# Patient Record
Sex: Male | Born: 1985 | Race: Black or African American | Hispanic: No | Marital: Single
Health system: Midwestern US, Community
[De-identification: ages and names within clinical notes are randomized; demographics above are authoritative.]

## PROBLEM LIST (undated history)

## (undated) DIAGNOSIS — K219 Gastro-esophageal reflux disease without esophagitis: Secondary | ICD-10-CM

## (undated) DIAGNOSIS — T7840XA Allergy, unspecified, initial encounter: Secondary | ICD-10-CM

## (undated) DIAGNOSIS — N179 Acute kidney failure, unspecified: Secondary | ICD-10-CM

## (undated) DIAGNOSIS — Z9109 Other allergy status, other than to drugs and biological substances: Secondary | ICD-10-CM

## (undated) HISTORY — PX: NO PAST SURGERIES: SHX2092

## (undated) HISTORY — DX: Gastro-esophageal reflux disease without esophagitis: K21.9

## (undated) HISTORY — DX: Allergy, unspecified, initial encounter: T78.40XA

---
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
## 2009-08-23 ENCOUNTER — Emergency Department (HOSPITAL_COMMUNITY): Admission: EM | Admit: 2009-08-23 | Discharge: 2009-08-23 | Payer: Self-pay | Admitting: Emergency Medicine

## 2011-02-05 ENCOUNTER — Inpatient Hospital Stay (INDEPENDENT_AMBULATORY_CARE_PROVIDER_SITE_OTHER)
Admission: RE | Admit: 2011-02-05 | Discharge: 2011-02-05 | Disposition: A | Discharge status: Home / Self Care | Payer: Self-pay | Source: Ambulatory Visit | Attending: Emergency Medicine | Admitting: Emergency Medicine

## 2011-02-05 ENCOUNTER — Ambulatory Visit (INDEPENDENT_AMBULATORY_CARE_PROVIDER_SITE_OTHER): Payer: Self-pay

## 2011-02-05 DIAGNOSIS — S40019A Contusion of unspecified shoulder, initial encounter: Secondary | ICD-10-CM

## 2011-02-05 DIAGNOSIS — D485 Neoplasm of uncertain behavior of skin: Secondary | ICD-10-CM

## 2012-04-29 ENCOUNTER — Emergency Department (HOSPITAL_COMMUNITY)
Admission: EM | Admit: 2012-04-29 | Discharge: 2012-04-29 | Disposition: A | Discharge status: Home / Self Care | Payer: Self-pay | Attending: Emergency Medicine | Admitting: Emergency Medicine

## 2012-04-29 ENCOUNTER — Encounter (HOSPITAL_COMMUNITY): Payer: Self-pay | Admitting: Emergency Medicine

## 2012-04-29 DIAGNOSIS — M549 Dorsalgia, unspecified: Secondary | ICD-10-CM | POA: Insufficient documentation

## 2012-04-29 MED ORDER — OXYCODONE-ACETAMINOPHEN 5-325 MG PO TABS: 2.0000 | ORAL_TABLET | ORAL | Status: AC | PRN

## 2012-04-29 MED ORDER — CYCLOBENZAPRINE HCL 10 MG PO TABS: 10.0000 mg | ORAL_TABLET | Freq: Two times a day (BID) | ORAL | Status: AC | PRN

## 2012-04-29 MED ORDER — IBUPROFEN 600 MG PO TABS: 600.0000 mg | ORAL_TABLET | Freq: Four times a day (QID) | ORAL | Status: AC | PRN

## 2012-04-29 NOTE — ED Notes (Signed)
Pt. Stated, I started having back pain lower yesterday at work, no injury

## 2012-04-29 NOTE — Discharge Instructions (Signed)
Back Exercises   Back exercises help treat and prevent back injuries. The goal of back exercises is to increase the strength of your abdominal and back muscles and the flexibility of your back. These exercises should be started when you no longer have back pain. Back exercises include:   Pelvic Tilt. Lie on your back with your knees bent. Tilt your pelvis until the lower part of your back is against the floor. Hold this position 5 to 10 sec and repeat 5 to 10 times.   Knee to Chest. Pull first 1 knee up against your chest and hold for 20 to 30 seconds, repeat this with the other knee, and then both knees. This may be done with the other leg straight or bent, whichever feels better.   Sit-Ups or Curl-Ups. Bend your knees 90 degrees. Start with tilting your pelvis, and do a partial, slow sit-up, lifting your trunk only 30 to 45 degrees off the floor. Take at least 2 to 3 seconds for each sit-up. Do not do sit-ups with your knees out straight. If partial sit-ups are difficult, simply do the above but with only tightening your abdominal muscles and holding it as directed.   Hip-Lift. Lie on your back with your knees flexed 90 degrees. Push down with your feet and shoulders as you raise your hips a couple inches off the floor; hold for 10 seconds, repeat 5 to 10 times.   Back arches. Lie on your stomach, propping yourself up on bent elbows. Slowly press on your hands, causing an arch in your low back. Repeat 3 to 5 times. Any initial stiffness and discomfort should lessen with repetition over time.   Shoulder-Lifts. Lie face down with arms beside your body. Keep hips and torso pressed to floor as you slowly lift your head and shoulders off the floor.   Do not overdo your exercises, especially in the beginning. Exercises may cause you some mild back discomfort which lasts for a few minutes; however, if the pain is more severe, or lasts for more than 15 minutes, do not continue exercises until you see your caregiver.  Improvement with exercise therapy for back problems is slow.   See your caregivers for assistance with developing a proper back exercise program.   Document Released: 11/29/2004 Document Revised: 10/11/2011 Document Reviewed: 10/22/2005   Exit[employer]® Patient Information ©2012 [employer].     Back Pain, Adult   Low back pain is very common. About 1 in 5 people have back pain. The cause of low back pain is rarely dangerous. The pain often gets better over time. About half of people with a sudden onset of back pain feel better in just 2 weeks. About 8 in 10 people feel better by 6 weeks.   CAUSES   Some common causes of back pain include:   Strain of the muscles or ligaments supporting the spine.   Wear and tear (degeneration) of the spinal discs.   Arthritis.   Direct injury to the back.   DIAGNOSIS   Most of the time, the direct cause of low back pain is not known. However, back pain can be treated effectively even when the exact cause of the pain is unknown. Answering your caregiver's questions about your overall health and symptoms is one of the most accurate ways to make sure the cause of your pain is not dangerous. If your caregiver needs more information, he or she may order lab work or imaging tests (X-rays or MRIs). However, even   if imaging tests show changes in your back, this usually does not require surgery.   HOME CARE INSTRUCTIONS   For many people, back pain returns. Since low back pain is rarely dangerous, it is often a condition that people can learn to manage on their own.   Remain active. It is stressful on the back to sit or stand in one place. Do not sit, drive, or stand in one place for more than 30 minutes at a time. Take short walks on level surfaces as soon as pain allows. Try to increase the length of time you walk each day.   Do not stay in bed. Resting more than 1 or 2 days can delay your recovery.   Do not avoid exercise or work. Your body is made to move. It is not dangerous to be active,  even though your back may hurt. Your back will likely heal faster if you return to being active before your pain is gone.   Pay attention to your body when you bend and lift. Many people have less discomfort when lifting if they bend their knees, keep the load close to their bodies, and avoid twisting. Often, the most comfortable positions are those that put less stress on your recovering back.   Find a comfortable position to sleep. Use a firm mattress and lie on your side with your knees slightly bent. If you lie on your back, put a pillow under your knees.   Only take over-the-counter or prescription medicines as directed by your caregiver. Over-the-counter medicines to reduce pain and inflammation are often the most helpful. Your caregiver may prescribe muscle relaxant drugs. These medicines help dull your pain so you can more quickly return to your normal activities and healthy exercise.   Put ice on the injured area.   Put ice in a plastic bag.   Place a towel between your skin and the bag.   Leave the ice on for 15 to 20 minutes, 3 to 4 times a day for the first 2 to 3 days. After that, ice and heat may be alternated to reduce pain and spasms.   Ask your caregiver about trying back exercises and gentle massage. This may be of some benefit.   Avoid feeling anxious or stressed. Stress increases muscle tension and can worsen back pain. It is important to recognize when you are anxious or stressed and learn ways to manage it. Exercise is a great option.   SEEK MEDICAL CARE IF:   You have pain that is not relieved with rest or medicine.   You have pain that does not improve in 1 week.   You have new symptoms.   You are generally not feeling well.   SEEK IMMEDIATE MEDICAL CARE IF:   You have pain that radiates from your back into your legs.   You develop new bowel or bladder control problems.   You have unusual weakness or numbness in your arms or legs.   You develop nausea or vomiting.   You develop abdominal  pain.   You feel faint.   Document Released: 10/22/2005 Document Revised: 10/11/2011 Document Reviewed: 03/12/2011   Exit[employer]® Patient Information ©2012 [employer].

## 2012-04-29 NOTE — ED Provider Notes (Signed)
History   This chart was scribed for Nelia Shi, MD by Sofie Rower. The patient was seen in room TR08C/TR08C and the patient's care was started at 2:16 PM      CSN: [id number]  Arrival date & time 04/29/12  1234   None     Chief Complaint  Patient presents with  . Back Pain    (Consider location/radiation/quality/duration/timing/severity/associated sxs/prior treatment) Patient is a 26 y.o. male presenting with back pain. The history is provided by the patient. No language interpreter was used.  Back Pain  This is a new problem. The current episode started yesterday. The problem occurs constantly. The problem has not changed since onset.The pain is associated with lifting heavy objects. The pain is present in the lumbar spine. The quality of the pain is described as aching. The pain does not radiate. The pain is moderate. The symptoms are aggravated by certain positions. The pain is the same all the time. Stiffness is present in the morning. Pertinent negatives include no fever, no numbness and no weakness. He has tried nothing for the symptoms. The treatment provided no relief.    Dillen Belmontes is a 26 y.o. male who presents to the Emergency Department complaining of moderate, episodic back pain located at the lower back onset yesterday. 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 The pt woke up this morning, 10:00AM and the back pain was worse. odifying factors include sitting down, lying down which provides moderate relief, taking tylenol 500mg  (X2) which provides moderate relief, standing up which intensifies the pain,   Pt denies wearing a brace, radiating back pain.     History  Substance Use Topics  . Smoking status: Not on file  . Smokeless tobacco: Not on file  . Alcohol Use: Not on file      Review of Systems  Constitutional: Negative for fever.  Musculoskeletal: Positive for back pain.  Neurological: Negative for weakness and numbness.  All  other systems reviewed and are negative.    Allergies  Review of patient's allergies indicates no known allergies.  Home Medications   Current Outpatient Rx  Name Route Sig Dispense Refill  . ACETAMINOPHEN 500 MG PO TABS Oral Take 1,000 mg by mouth every 6 (six) hours as needed. For pain    . CYCLOBENZAPRINE HCL 10 MG PO TABS Oral Take 1 tablet (10 mg total) by mouth 2 (two) times daily as needed for muscle spasms. 20 tablet 0  . IBUPROFEN 600 MG PO TABS Oral Take 1 tablet (600 mg total) by mouth every 6 (six) hours as needed for pain. 30 tablet 0  . OXYCODONE-ACETAMINOPHEN 5-325 MG PO TABS Oral Take 2 tablets by mouth every 4 (four) hours as needed for pain. 6 tablet 0    BP 128/50  Pulse 59  Temp 97.8 F (36.6 C) (Oral)  Resp 20  SpO2 96%  Physical Exam  Nursing note and vitals reviewed. Constitutional: He is oriented to person, place, and time. He appears well-developed and well-nourished. No distress.  HENT:  Head: Normocephalic and atraumatic.  Eyes: Pupils are equal, round, and reactive to light.  Neck: Normal range of motion.  Cardiovascular: Normal rate and intact distal pulses.   Pulmonary/Chest: No respiratory distress.  Abdominal: Normal appearance. He exhibits no distension.  Musculoskeletal:       Lumbar back: He exhibits decreased range of motion and pain.  Neurological: He is alert and oriented to person, place, and time. No cranial nerve  deficit.  Skin: Skin is warm and dry. No rash noted.  Psychiatric: He has a normal mood and affect. His behavior is normal.    ED Course  Procedures (including critical care time)  DIAGNOSTIC STUDIES: Oxygen Saturation is 96% on room air, adequate by my interpretation.    COORDINATION OF CARE:     Labs Reviewed - No data to display No results found.   1. Back pain       MDM        I personally performed the services described in this documentation, which was scribed in my presence. The recorded  information has been reviewed and considered.     Nelia Shi, MD 04/29/12 [phone]

## 2012-09-15 ENCOUNTER — Encounter (HOSPITAL_COMMUNITY): Payer: Self-pay | Admitting: *Deleted

## 2012-09-15 ENCOUNTER — Emergency Department (HOSPITAL_COMMUNITY)
Admission: EM | Admit: 2012-09-15 | Discharge: 2012-09-15 | Disposition: A | Discharge status: Home / Self Care | Payer: Self-pay | Source: Home / Self Care | Attending: Family Medicine | Admitting: Family Medicine

## 2012-09-15 DIAGNOSIS — S335XXA Sprain of ligaments of lumbar spine, initial encounter: Secondary | ICD-10-CM

## 2012-09-15 DIAGNOSIS — S39012A Strain of muscle, fascia and tendon of lower back, initial encounter: Secondary | ICD-10-CM

## 2012-09-15 HISTORY — DX: Other allergy status, other than to drugs and biological substances: Z91.09

## 2012-09-15 MED ORDER — NAPROXEN 500 MG PO TABS: 500.0000 mg | ORAL_TABLET | Freq: Two times a day (BID) | ORAL | Status: DC

## 2012-09-15 MED ORDER — CYCLOBENZAPRINE HCL 10 MG PO TABS: 10.0000 mg | ORAL_TABLET | Freq: Two times a day (BID) | ORAL | Status: DC | PRN

## 2012-09-15 NOTE — ED Provider Notes (Signed)
Medical screening examination/treatment/procedure(s) were performed by resident physician or non-physician practitioner and as supervising physician I was immediately available for consultation/collaboration.   Barkley Bruns MD.    Linna Hoff, MD 09/15/12 2044

## 2012-09-15 NOTE — ED Notes (Signed)
Reports back injury (muscle strain) at work 3 months ago - was seen in ED and given oxycodone and muscle relaxer per pt.  Yesterday was playing basketball and felt sudden low back pain as he moved in certain position.  Denies pain when sitting, but c/o pain when getting up and with particular movements.  Denies radiculopathy; denies parasthesias.  Has tried OTC pain reliever (unk which).

## 2012-09-15 NOTE — ED Provider Notes (Signed)
History     CSN: [id number]  Arrival date & time 09/15/12  1826   First MD Initiated Contact with Patient 09/15/12 1946      Chief Complaint  Patient presents with  . Back Pain    (Consider location/radiation/quality/duration/timing/severity/associated sxs/prior treatment) Patient is a 26 y.o. male presenting with back pain. The history is provided by the patient.  Back Pain  This is a new problem. The current episode started 12 to 24 hours ago. The problem occurs daily. The problem has not changed since onset.The pain is associated with twisting (while playing basketball yesterday). The pain is present in the lumbar spine (left lower). The quality of the pain is described as aching. The pain does not radiate. The pain is mild. The symptoms are aggravated by twisting, bending and certain positions. The pain is the same all the time. Pertinent negatives include no numbness, no abdominal pain, no bowel incontinence, no perianal numbness, no bladder incontinence, no dysuria, no pelvic pain, no leg pain, no paresthesias, no paresis, no tingling and no weakness. He has tried NSAIDs for the symptoms. The treatment provided mild relief. Risk factors include obesity, lack of exercise, poor posture and a sedentary lifestyle.    Past Medical History  Diagnosis Date  . Environmental allergies     History reviewed. No pertinent past surgical history.  No family history on file.  History  Substance Use Topics  . Smoking status: Former Smoker    Types: Cigars  . Smokeless tobacco: Not on file  . Alcohol Use: No      Review of Systems  Gastrointestinal: Negative for abdominal pain and bowel incontinence.  Genitourinary: Negative for bladder incontinence, dysuria and pelvic pain.  Musculoskeletal: Positive for back pain.  Neurological: Negative for tingling, weakness, numbness and paresthesias.  All other systems reviewed and are negative.    Allergies  Review of patient's allergies  indicates no known allergies.  Home Medications   Current Outpatient Rx  Name  Route  Sig  Dispense  Refill  . ACETAMINOPHEN 500 MG PO TABS   Oral   Take 1,000 mg by mouth every 6 (six) hours as needed. For pain         . ZYRTEC PO   Oral   Take by mouth.         . CYCLOBENZAPRINE HCL 10 MG PO TABS   Oral   Take 1 tablet (10 mg total) by mouth 2 (two) times daily as needed for muscle spasms.   20 tablet   0   . NAPROXEN 500 MG PO TABS   Oral   Take 1 tablet (500 mg total) by mouth 2 (two) times daily.   45 tablet   0     BP 129/74  Pulse 67  Temp 98.2 F (36.8 C) (Oral)  Resp 18  SpO2 98%  Physical Exam  Nursing note and vitals reviewed. Constitutional: He is oriented to person, place, and time. Vital signs are normal. He appears well-developed and well-nourished. He is active and cooperative.  HENT:  Head: Normocephalic.  Eyes: Conjunctivae normal are normal. Pupils are equal, round, and reactive to light. No scleral icterus.  Neck: Trachea normal. Neck supple.  Cardiovascular: Normal rate, regular rhythm, normal heart sounds and intact distal pulses.   Pulmonary/Chest: Effort normal and breath sounds normal.  Musculoskeletal: Normal range of motion.       Right hip: Normal.       Left hip: Normal.  Cervical back: Normal.       Thoracic back: Normal.       Lumbar back: Normal.  Neurological: He is alert and oriented to person, place, and time. No cranial nerve deficit or sensory deficit.  Skin: Skin is warm and dry.  Psychiatric: He has a normal mood and affect. His speech is normal and behavior is normal. Judgment and thought content normal. Cognition and memory are normal.    ED Course  Procedures (including critical care time)  Labs Reviewed - No data to display No results found.   1. Lumbar strain       MDM  Nsaids, muscle relaxants, heat therapy.        Johnsie Kindred, NP 09/15/12 2002

## 2013-10-09 ENCOUNTER — Encounter (HOSPITAL_COMMUNITY): Payer: Self-pay | Admitting: Emergency Medicine

## 2013-10-09 ENCOUNTER — Emergency Department (INDEPENDENT_AMBULATORY_CARE_PROVIDER_SITE_OTHER)
Admission: EM | Admit: 2013-10-09 | Discharge: 2013-10-09 | Disposition: A | Discharge status: Home / Self Care | Payer: Self-pay | Source: Home / Self Care

## 2013-10-09 DIAGNOSIS — S338XXA Sprain of other parts of lumbar spine and pelvis, initial encounter: Secondary | ICD-10-CM

## 2013-10-09 DIAGNOSIS — S39012A Strain of muscle, fascia and tendon of lower back, initial encounter: Secondary | ICD-10-CM

## 2013-10-09 MED ORDER — DICLOFENAC POTASSIUM 50 MG PO TABS: 50.0000 mg | ORAL_TABLET | Freq: Three times a day (TID) | ORAL | Status: DC

## 2013-10-09 MED ORDER — TRAMADOL HCL 50 MG PO TABS: 50.0000 mg | ORAL_TABLET | Freq: Four times a day (QID) | ORAL | Status: DC | PRN
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
## 2013-10-09 NOTE — ED Provider Notes (Signed)
Medical screening examination/treatment/procedure(s) were performed by resident physician or non-physician practitioner and as supervising physician I was immediately available for consultation/collaboration.   Barkley Bruns MD.   Linna Hoff, MD 10/09/13 [phone]

## 2014-07-29 ENCOUNTER — Ambulatory Visit: Payer: Self-pay | Admitting: Internal Medicine

## 2014-09-06 ENCOUNTER — Encounter: Payer: Self-pay | Admitting: Internal Medicine

## 2014-09-06 DIAGNOSIS — Z0289 Encounter for other administrative examinations: Secondary | ICD-10-CM

## 2014-09-06 NOTE — Progress Notes (Signed)
error 

## 2014-10-19 ENCOUNTER — Ambulatory Visit: Payer: Self-pay | Admitting: Internal Medicine

## 2014-11-03 ENCOUNTER — Inpatient Hospital Stay
Admit: 2014-11-03 | Discharge: 2014-11-03 | Disposition: A | Discharge status: Home / Self Care | Payer: Self-pay | Attending: Emergency Medicine

## 2014-11-03 DIAGNOSIS — J029 Acute pharyngitis, unspecified: Secondary | ICD-10-CM
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
## 2014-11-03 NOTE — ED Notes (Signed)
Provider has gone over discharge instructions with patient. Patient has no further questions at this time. Patient is not in any apparent distress and will ambulate upon discharge.

## 2015-02-22 ENCOUNTER — Emergency Department (HOSPITAL_COMMUNITY)
Admission: EM | Admit: 2015-02-22 | Discharge: 2015-02-22 | Disposition: A | Discharge status: Home / Self Care | Payer: Self-pay | Attending: Emergency Medicine | Admitting: Emergency Medicine

## 2015-02-22 ENCOUNTER — Encounter (HOSPITAL_COMMUNITY): Payer: Self-pay | Admitting: *Deleted

## 2015-02-22 DIAGNOSIS — L089 Local infection of the skin and subcutaneous tissue, unspecified: Secondary | ICD-10-CM

## 2015-02-22 DIAGNOSIS — Z87891 Personal history of nicotine dependence: Secondary | ICD-10-CM | POA: Insufficient documentation

## 2015-02-22 DIAGNOSIS — L723 Sebaceous cyst: Secondary | ICD-10-CM | POA: Insufficient documentation

## 2015-02-22 DIAGNOSIS — Z791 Long term (current) use of non-steroidal anti-inflammatories (NSAID): Secondary | ICD-10-CM | POA: Insufficient documentation

## 2015-02-22 MED ORDER — SULFAMETHOXAZOLE-TRIMETHOPRIM 800-160 MG PO TABS: 1.0000 | ORAL_TABLET | Freq: Two times a day (BID) | ORAL | Status: AC

## 2015-02-22 MED ORDER — CEPHALEXIN 500 MG PO CAPS: 500.0000 mg | ORAL_CAPSULE | Freq: Four times a day (QID) | ORAL | Status: DC

## 2015-02-22 MED ORDER — HYDROCODONE-ACETAMINOPHEN 5-325 MG PO TABS: 2.0000 | ORAL_TABLET | ORAL | Status: DC | PRN

## 2015-02-22 NOTE — ED Provider Notes (Signed)
CSN: [id number]     Arrival date & time 02/22/15  1130 History  This chart was scribed for non-physician practitioner, Alvina Chou, working with Carmin Muskrat, MD by Molli Posey, ED Scribe. This patient was seen in room TR07C/TR07C and the patient's care was started at 12:05 PM.  Chief Complaint  Patient presents with  . Cyst   The history is provided by the patient. No language interpreter was used.   HPI Comments: Dennis Harvey is a 29 y.o. male who presents to the Emergency Department complaining of a cyst on the right side of his head that started draining this morning. He states that he noticed a brown and white discharge. Pt states that his cyst has been increasing in size in the last 4 years. He states that he experiences pain when touching the cyst. He reports that he that his cysts presented when he was about 29 years old. He reports no alleviating factors at this time.    Past Medical History  Diagnosis Date  . Environmental allergies    History reviewed. No pertinent past surgical history. No family history on file. History  Substance Use Topics  . Smoking status: Former Smoker    Types: Cigars  . Smokeless tobacco: Not on file  . Alcohol Use: No    Review of Systems  Skin: Positive for wound.       Cyst  All other systems reviewed and are negative.   Allergies  Review of patient's allergies indicates no known allergies.  Home Medications   Prior to Admission medications   Medication Sig Start Date End Date Taking? Authorizing Provider  acetaminophen (TYLENOL) 500 MG tablet Take 1,000 mg by mouth every 6 (six) hours as needed. For pain    Historical Provider, MD  Cetirizine HCl (ZYRTEC PO) Take by mouth.    Historical Provider, MD  cyclobenzaprine (FLEXERIL) 10 MG tablet Take 1 tablet (10 mg total) by mouth 2 (two) times daily as needed for muscle spasms. 09/15/12   Awilda Metro, NP  diclofenac (CATAFLAM) 50 MG tablet Take 1 tablet (50 mg total)  by mouth 3 (three) times daily. With food prn back pain 10/09/13   Janne Napoleon, NP  naproxen (NAPROSYN) 500 MG tablet Take 1 tablet (500 mg total) by mouth 2 (two) times daily. 09/15/12   Awilda Metro, NP  traMADol (ULTRAM) 50 MG tablet Take 1 tablet (50 mg total) by mouth every 6 (six) hours as needed. 10/09/13   Janne Napoleon, NP   BP 117/59 mmHg  Pulse 77  Temp(Src) 98.2 F (36.8 C) (Oral)  Resp 16  Ht 5\' 8"  (1.727 m)  SpO2 96% Physical Exam  Constitutional: He is oriented to person, place, and time. He appears well-developed and well-nourished.  HENT:  Head: Normocephalic and atraumatic.  Eyes: EOM are normal. Right eye exhibits no discharge. Left eye exhibits no discharge.  Neck: Neck supple. No tracheal deviation present.  Cardiovascular: Normal rate.   Pulmonary/Chest: Effort normal. No respiratory distress.  Abdominal: He exhibits no distension. There is no tenderness.  Musculoskeletal: Normal range of motion.  Neurological: He is alert and oriented to person, place, and time. Coordination normal.  Skin: Skin is warm and dry.  Tennis ball sized fluctuance and tender mass to right temporal area of scalp. Purulent drainage noted.   Psychiatric: He has a normal mood and affect. His behavior is normal.  Nursing note and vitals reviewed.   ED Course  Procedures   DIAGNOSTIC STUDIES: Oxygen  Saturation is 96% on RA, normal by my interpretation.    COORDINATION OF CARE: 12:10 PM Discussed treatment plan with pt at bedside and pt agreed to plan.  INCISION AND DRAINAGE Performed by: Alvina Chou Consent: Verbal consent obtained. Risks and benefits: risks, benefits and alternatives were discussed Type: abscess  Body area: right temporal scalp  Anesthesia: topical spray  Incision was made with a scalpel.  Complexity: complex Blunt dissection to break up loculations  Drainage: purulent, sebaceous   Drainage amount: copious, 30 cc  Packing material: none  Patient  tolerance: Patient tolerated the procedure well with no immediate complications.     Labs Review Labs Reviewed - No data to display  Imaging Review No results found.   EKG Interpretation None      MDM   Final diagnoses:  Infected sebaceous cyst of skin    Patient's cyst drained and will be treated with bactrim, keflex, and vicodin. Vitals stable and patient afebrile. Patient instructed to follow up with plastic surgery.   I personally performed the services described in this documentation, which was scribed in my presence. The recorded information has been reviewed and is accurate.      [address] [place], PA-C 02/23/15 0263  Carmin Muskrat, MD 02/25/15 7858  Carmin Muskrat, MD 02/25/15 [phone]

## 2015-02-22 NOTE — ED Notes (Signed)
Pt states that he was at home this morning when his cyst on his head burst. Pt states that it had brown and white discharge. Pt states that he has had this since he was a child and this is the first time that he has had drainage. No foul odor reported. Pt states that they are hereditary.

## 2015-02-22 NOTE — Discharge Instructions (Signed)
Take Bactrim and Keflex as directed until gone. Take Vicodin as needed for pain. Follow up with Dr. Iran Planas for further evaluation. Refer to attached documents for more information.

## 2016-05-14 ENCOUNTER — Emergency Department (HOSPITAL_COMMUNITY)
Admission: EM | Admit: 2016-05-14 | Discharge: 2016-05-14 | Disposition: A | Discharge status: Home / Self Care | Payer: Medicaid Other | Attending: Emergency Medicine | Admitting: Emergency Medicine

## 2016-05-14 ENCOUNTER — Emergency Department (HOSPITAL_COMMUNITY): Payer: Medicaid Other

## 2016-05-14 ENCOUNTER — Encounter (HOSPITAL_COMMUNITY): Payer: Self-pay | Admitting: *Deleted

## 2016-05-14 DIAGNOSIS — L720 Epidermal cyst: Secondary | ICD-10-CM | POA: Insufficient documentation

## 2016-05-14 DIAGNOSIS — L729 Follicular cyst of the skin and subcutaneous tissue, unspecified: Secondary | ICD-10-CM

## 2016-05-14 DIAGNOSIS — R0789 Other chest pain: Secondary | ICD-10-CM | POA: Diagnosis not present

## 2016-05-14 DIAGNOSIS — R079 Chest pain, unspecified: Secondary | ICD-10-CM | POA: Diagnosis present

## 2016-05-14 DIAGNOSIS — M5412 Radiculopathy, cervical region: Secondary | ICD-10-CM

## 2016-05-14 DIAGNOSIS — Z87891 Personal history of nicotine dependence: Secondary | ICD-10-CM | POA: Insufficient documentation

## 2016-05-14 LAB — BASIC METABOLIC PANEL
Anion gap: 6 (ref 5–15)
BUN: 9 mg/dL (ref 6–20)
CO2: 27 mmol/L (ref 22–32)
CREATININE: 1.23 mg/dL (ref 0.61–1.24)
Calcium: 9.7 mg/dL (ref 8.9–10.3)
Chloride: 105 mmol/L (ref 101–111)
GFR calc Af Amer: >60 mL/min (ref >60)
GFR calc non Af Amer: >60 mL/min (ref >60)
Glucose, Bld: 111 mg/dL — ABNORMAL HIGH (ref 65–99)
Potassium: 3.2 mmol/L — ABNORMAL LOW (ref 3.5–5.1)
Sodium: 138 mmol/L (ref 135–145)

## 2016-05-14 LAB — CBC
HCT: 48.8 % (ref 39.0–52.0)
HEMOGLOBIN: 15.8 g/dL (ref 13.0–17.0)
MCH: 29.5 pg (ref 26.0–34.0)
MCHC: 32.4 g/dL (ref 30.0–36.0)
MCV: 91.2 fL (ref 78.0–100.0)
PLATELETS: 312 10*3/uL (ref 150–400)
RBC: 5.35 MIL/uL (ref 4.22–5.81)
RDW: 13 % (ref 11.5–15.5)
WBC: 7.2 10*3/uL (ref 4.0–10.5)

## 2016-05-14 LAB — I-STAT TROPONIN, ED: Troponin i, poc: 0 ng/mL (ref 0.00–0.08)

## 2016-05-14 MED ORDER — KETOROLAC TROMETHAMINE 30 MG/ML IJ SOLN
30.0000 mg | Freq: Once | INTRAMUSCULAR | Status: AC
  Administered 2016-05-14: 30 mg via INTRAVENOUS
  Filled 2016-05-14: qty 1

## 2016-05-14 MED ORDER — POTASSIUM CHLORIDE CRYS ER 20 MEQ PO TBCR: 20.0000 meq | EXTENDED_RELEASE_TABLET | Freq: Every day | ORAL | Status: DC

## 2016-05-14 NOTE — Discharge Instructions (Signed)
For pain control please take ibuprofen (also known as Motrin or Advil) 800mg  (this is normally 4 over the counter pills) 3 times a day  for 5 days. Take with food to minimize stomach irritation.   Please follow with your primary care doctor in the next 2 days for a check-up. They must obtain records for further management.   Do not hesitate to return to the Emergency Department for any new, worsening or concerning symptoms.    Cervical Radiculopathy Cervical radiculopathy happens when a nerve in the neck (cervical nerve) is pinched or bruised. This condition can develop because of an injury or as part of the normal aging process. Pressure on the cervical nerves can cause pain or numbness that runs from the neck all the way down into the arm and fingers. Usually, this condition gets better with rest. Treatment may be needed if the condition does not improve.  CAUSES This condition may be caused by:  Injury.  Slipped (herniated) disk.  Muscle tightness in the neck because of overuse.  Arthritis.  Breakdown or degeneration in the bones and joints of the spine (spondylosis) due to aging.  Bone spurs that may develop near the cervical nerves. SYMPTOMS Symptoms of this condition include:  Pain that runs from the neck to the arm and hand. The pain can be severe or irritating. It may be worse when the neck is moved.  Numbness or weakness in the affected arm and hand. DIAGNOSIS This condition may be diagnosed based on symptoms, medical history, and a physical exam. You may also have tests, including:  X-rays.  CT scan.  MRI.  Electromyogram (EMG).  Nerve conduction tests. TREATMENT In many cases, treatment is not needed for this condition. With rest, the condition usually gets better over time. If treatment is needed, options may include:  Wearing a soft neck collar for short periods of time.  Physical therapy to strengthen your neck muscles.  Medicines, such as NSAIDs, oral  corticosteroids, or spinal injections.  Surgery. This may be needed if other treatments do not help. Various types of surgery may be done depending on the cause of your problems. HOME CARE INSTRUCTIONS Managing Pain  Take over-the-counter and prescription medicines only as told by your health care provider.  If directed, apply ice to the affected area.  Put ice in a plastic bag.  Place a towel between your skin and the bag.  Leave the ice on for 20 minutes, 2-3 times per day.  If ice does not help, you can try using heat. Take a warm shower or warm bath, or use a heat pack as told by your health care provider.  Try a gentle neck and shoulder massage to help relieve symptoms. Activity  Rest as needed. Follow instructions from your health care provider about any restrictions on activities.  Do stretching and strengthening exercises as told by your health care provider or physical therapist. General Instructions  If you were given a soft collar, wear it as told by your health care provider.  Use a flat pillow when you sleep.  Keep all follow-up visits as told by your health care provider. This is important. SEEK MEDICAL CARE IF:  Your condition does not improve with treatment. SEEK IMMEDIATE MEDICAL CARE IF:  Your pain gets much worse and cannot be controlled with medicines.  You have weakness or numbness in your hand, arm, face, or leg.  You have a high fever.  You have a stiff, rigid neck.  You lose  control of your bowels or your bladder (have incontinence).  You have trouble with walking, balance, or speaking.   This information is not intended to replace advice given to you by your health care provider. Make sure you discuss any questions you have with your health care provider.   Document Released: 07/17/2001 Document Revised: 07/13/2015 Document Reviewed: 12/16/2014 Elsevier Interactive Patient Education [employer].

## 2016-05-14 NOTE — ED Notes (Signed)
PT states he has been having chest pains for a while.  On Saturday started having numbness in left arm and states that it felt asleep.  Pt states tingling feeling in finger.  Pt has no grip weakness, facial deficit.

## 2016-05-14 NOTE — ED Notes (Signed)
Pt mother at bedside

## 2016-05-14 NOTE — ED Provider Notes (Signed)
CSN: [id number]     Arrival date & time 05/14/16  1254 History   First MD Initiated Contact with Patient 05/14/16 1801     Chief Complaint  Patient presents with  . Chest Pain  . Numbness     (Consider location/radiation/quality/duration/timing/severity/associated sxs/prior Treatment) HPI   Blood pressure 111/68, pulse 81, temperature 98.2 F (36.8 C), temperature source Oral, resp. rate 16, SpO2 99 %.  Drazen Eckert is a 30 y.o. male complaining of intermittent diffuse anterior chest pain onset several months ago lasting a few minutes, described as sharp with no associated shortness of breath, palpitations, nausea, vomiting, diaphoresis, cocaine, methamphetamine use, tobacco use, diabetes, hypertension, family history of early cardiac death, history of DVT/PE, recent immobilizations, calf pain, leg swelling. Last episode of chest pain was this morning, resolves spontaneously after a few minutes. Patient noticed a left hand tingling paresthesia in 5 fingers one week ago while he was at a movie theater, it lasted throughout the movie and then spontaneously resolved at the end. He had a similar paresthesia upon waking this morning. Stayed with him throughout the day, patient denies cervicalgia, weakness, headache, change of vision, dysarthria, ataxia.    Past Medical History  Diagnosis Date  . Environmental allergies    History reviewed. No pertinent past surgical history. No family history on file. Social History  Substance Use Topics  . Smoking status: Former Smoker    Types: Cigars  . Smokeless tobacco: None  . Alcohol Use: No    Review of Systems  10 systems reviewed and found to be negative, except as noted in the HPI.   Allergies  Review of patient's allergies indicates no known allergies.  Home Medications   Prior to Admission medications   Medication Sig Start Date End Date Taking? Authorizing Provider  cephALEXin (KEFLEX) 500 MG capsule Take 1 capsule (500 mg  total) by mouth 4 (four) times daily. 02/22/15   Kaitlyn Szekalski, PA-C  cyclobenzaprine (FLEXERIL) 10 MG tablet Take 1 tablet (10 mg total) by mouth 2 (two) times daily as needed for muscle spasms. 09/15/12   Awilda Metro, NP  diclofenac (CATAFLAM) 50 MG tablet Take 1 tablet (50 mg total) by mouth 3 (three) times daily. With food prn back pain 10/09/13   Janne Napoleon, NP  HYDROcodone-acetaminophen (NORCO/VICODIN) 5-325 MG per tablet Take 2 tablets by mouth every 4 (four) hours as needed. 02/22/15   Kaitlyn Szekalski, PA-C  naproxen (NAPROSYN) 500 MG tablet Take 1 tablet (500 mg total) by mouth 2 (two) times daily. 09/15/12   Awilda Metro, NP  traMADol (ULTRAM) 50 MG tablet Take 1 tablet (50 mg total) by mouth every 6 (six) hours as needed. 10/09/13   Janne Napoleon, NP   BP 137/79 mmHg  Pulse 63  Temp(Src) 98.2 F (36.8 C) (Oral)  Resp 12  SpO2 100% Physical Exam  Constitutional: He is oriented to person, place, and time. He appears well-developed and well-nourished. No distress.  HENT:  Head: Normocephalic.  Mouth/Throat: Oropharynx is clear and moist.  Eyes: Conjunctivae are normal.  Neck: Normal range of motion. No JVD present. No tracheal deviation present.  No midline C-spine  tenderness to palpation or step-offs appreciated. Patient has full range of motion without pain.  Grip strength, biceps, triceps 5/5 bilaterally;  can differentiate between pinprick and light touch bilaterally.  Spurling test is negative bilaterally   Cardiovascular: Normal rate, regular rhythm and intact distal pulses.   Radial pulse equal bilaterally  Pulmonary/Chest: Effort normal and  breath sounds normal. No stridor. No respiratory distress. He has no wheezes. He has no rales. He exhibits no tenderness.  Abdominal: Soft. He exhibits no distension and no mass. There is no tenderness. There is no rebound and no guarding.  Musculoskeletal: Normal range of motion. He exhibits no edema or tenderness.  No calf  asymmetry, superficial collaterals, palpable cords, edema, Homans sign negative bilaterally.    Neurological: He is alert and oriented to person, place, and time.  Skin: Skin is warm. He is not diaphoretic.  Multiple cysts to scalp ranging from 5-7 cm, some with active drainage.  Psychiatric: He has a normal mood and affect.  Nursing note and vitals reviewed.   ED Course  Procedures (including critical care time) Labs Review Labs Reviewed  BASIC METABOLIC PANEL - Abnormal; Notable for the following:    Potassium 3.2 (*)    Glucose, Bld 111 (*)    All other components within normal limits  CBC  I-STAT TROPOININ, ED    Imaging Review Dg Chest 2 View  05/14/2016  CLINICAL DATA:  Chest pain and left arm numbness for 1 week. EXAM: CHEST  2 VIEW COMPARISON:  None. FINDINGS: The heart size and mediastinal contours are within normal limits. Both lungs are clear. The visualized skeletal structures are unremarkable. IMPRESSION: Normal examination. Electronically Signed   By: Claudie Revering M.D.   On: 05/14/2016 14:09   I have personally reviewed and evaluated these images and lab results as part of my medical decision-making.   EKG Interpretation   Date/Time:  Monday May 14 2016 13:39:41 EDT Ventricular Rate:  67 PR Interval:  138 QRS Duration: 96 QT Interval:  358 QTC Calculation: 378 R Axis:   52 Text Interpretation:  Normal sinus rhythm with sinus arrhythmia Normal ECG  Confirmed by Alvino Chapel  MD, NATHAN [phone]) on 05/14/2016 8:35:19 PM      MDM   Final diagnoses:  None    Filed Vitals:   05/14/16 1845 05/14/16 1900 05/14/16 2009 05/14/16 2055  BP: 132/79 131/70 111/68 114/74  Pulse: 69 63 81 84  Temp:      TempSrc:      Resp: 21 16  16   SpO2: 100% 100% 99% 100%    Medications  ketorolac (TORADOL) 30 MG/ML injection 30 mg (30 mg Intravenous Given 05/14/16 1911)    Tag Woitas is 30 y.o. male presenting with Chest pain over the course of several months, sounds  atypical for being cardiac in nature. Patient is low risk by heart score, normal EKG, negative troponin. Normal chest x-ray. She is potassium is slightly low at 3.2, at this level and given the unilateral nature of his paresthesia I doubt this is the source of his anesthesia. He has excellent strength with no midline C-spine tenderness. Case discussed with case management to try to arrange to get this patient outpatient care.   Evaluation does not show pathology that would require ongoing emergent intervention or inpatient treatment. Pt is hemodynamically stable and mentating appropriately. Discussed findings and plan with patient/guardian, who agrees with care plan. All questions answered. Return precautions discussed and outpatient follow up given.   New Prescriptions   POTASSIUM CHLORIDE SA (K-DUR,KLOR-CON) 20 MEQ TABLET    Take 1 tablet (20 mEq total) by mouth daily.         Monico Blitz, PA-C 05/14/16 2101  Davonna Belling, MD 05/15/16 0040

## 2016-05-17 ENCOUNTER — Telehealth: Payer: Self-pay | Admitting: *Deleted

## 2016-05-17 NOTE — Telephone Encounter (Signed)
Contacted by patient requesting information regarding low K+.  Recommend hydration and advised to fill prescription written at ED visit on 05/14/2016

## 2016-05-18 ENCOUNTER — Emergency Department (HOSPITAL_COMMUNITY): Payer: Medicaid Other

## 2016-05-18 ENCOUNTER — Encounter (HOSPITAL_COMMUNITY): Payer: Self-pay

## 2016-05-18 ENCOUNTER — Inpatient Hospital Stay (HOSPITAL_COMMUNITY)
Admission: EM | Admit: 2016-05-18 | Discharge: 2016-05-22 | DRG: 684 | Disposition: A | Discharge status: Home / Self Care | Payer: Medicaid Other | Attending: Family Medicine | Admitting: Family Medicine

## 2016-05-18 DIAGNOSIS — N179 Acute kidney failure, unspecified: Secondary | ICD-10-CM

## 2016-05-18 DIAGNOSIS — E861 Hypovolemia: Secondary | ICD-10-CM | POA: Diagnosis present

## 2016-05-18 DIAGNOSIS — N170 Acute kidney failure with tubular necrosis: Principal | ICD-10-CM | POA: Diagnosis present

## 2016-05-18 DIAGNOSIS — R739 Hyperglycemia, unspecified: Secondary | ICD-10-CM

## 2016-05-18 DIAGNOSIS — K37 Unspecified appendicitis: Secondary | ICD-10-CM | POA: Diagnosis present

## 2016-05-18 DIAGNOSIS — R1031 Right lower quadrant pain: Secondary | ICD-10-CM

## 2016-05-18 DIAGNOSIS — Z79899 Other long term (current) drug therapy: Secondary | ICD-10-CM

## 2016-05-18 DIAGNOSIS — Z833 Family history of diabetes mellitus: Secondary | ICD-10-CM

## 2016-05-18 DIAGNOSIS — Z7982 Long term (current) use of aspirin: Secondary | ICD-10-CM

## 2016-05-18 DIAGNOSIS — Z87891 Personal history of nicotine dependence: Secondary | ICD-10-CM

## 2016-05-18 HISTORY — DX: Acute kidney failure, unspecified: N17.9

## 2016-05-18 LAB — COMPREHENSIVE METABOLIC PANEL
ALT: 23 U/L (ref 17–63)
AST: 25 U/L (ref 15–41)
Albumin: 3.9 g/dL (ref 3.5–5.0)
Alkaline Phosphatase: 55 U/L (ref 38–126)
Anion gap: 7 (ref 5–15)
BILIRUBIN TOTAL: 1 mg/dL (ref 0.3–1.2)
BUN: 22 mg/dL — AB (ref 6–20)
CO2: 27 mmol/L (ref 22–32)
CREATININE: 2.56 mg/dL — AB (ref 0.61–1.24)
Calcium: 8.8 mg/dL — ABNORMAL LOW (ref 8.9–10.3)
Chloride: 105 mmol/L (ref 101–111)
GFR calc Af Amer: 37 mL/min — ABNORMAL LOW (ref >60)
GFR, EST NON AFRICAN AMERICAN: 32 mL/min — AB (ref >60)
Glucose, Bld: 124 mg/dL — ABNORMAL HIGH (ref 65–99)
Potassium: 3.9 mmol/L (ref 3.5–5.1)
Sodium: 139 mmol/L (ref 135–145)
TOTAL PROTEIN: 7.8 g/dL (ref 6.5–8.1)

## 2016-05-18 LAB — CBC
HCT: 42.4 % (ref 39.0–52.0)
Hemoglobin: 13.9 g/dL (ref 13.0–17.0)
MCH: 29.5 pg (ref 26.0–34.0)
MCHC: 32.8 g/dL (ref 30.0–36.0)
MCV: 90 fL (ref 78.0–100.0)
Platelets: 290 10*3/uL (ref 150–400)
RBC: 4.71 MIL/uL (ref 4.22–5.81)
RDW: 12.9 % (ref 11.5–15.5)
WBC: 9.6 10*3/uL (ref 4.0–10.5)

## 2016-05-18 LAB — URINALYSIS, ROUTINE W REFLEX MICROSCOPIC
Bilirubin Urine: NEGATIVE
GLUCOSE, UA: NEGATIVE mg/dL
Ketones, ur: NEGATIVE mg/dL
Leukocytes, UA: NEGATIVE
Nitrite: NEGATIVE
Protein, ur: 100 mg/dL — AB
SPECIFIC GRAVITY, URINE: 1.014 (ref 1.005–1.030)
pH: 5.5 (ref 5.0–8.0)

## 2016-05-18 LAB — URINE MICROSCOPIC-ADD ON: RBC / HPF: NONE SEEN RBC/hpf (ref 0–5)

## 2016-05-18 LAB — LIPASE, BLOOD: Lipase: 23 U/L (ref 11–51)

## 2016-05-18 MED ORDER — SODIUM CHLORIDE 0.9 % IV BOLUS (SEPSIS)
1000.0000 mL | Freq: Once | INTRAVENOUS | Status: AC
  Administered 2016-05-18: 1000 mL via INTRAVENOUS

## 2016-05-18 MED ORDER — POTASSIUM CHLORIDE IN NACL 20-0.9 MEQ/L-% IV SOLN
INTRAVENOUS | Status: DC
  Administered 2016-05-18 – 2016-05-21 (×2): via INTRAVENOUS
  Filled 2016-05-18 (×6): qty 1000

## 2016-05-18 MED ORDER — METRONIDAZOLE IN NACL 5-0.79 MG/ML-% IV SOLN
500.0000 mg | Freq: Three times a day (TID) | INTRAVENOUS | Status: DC
  Administered 2016-05-19 (×2): 500 mg via INTRAVENOUS
  Filled 2016-05-18 (×3): qty 100

## 2016-05-18 MED ORDER — DIPHENHYDRAMINE HCL 50 MG/ML IJ SOLN: 25.0000 mg | Freq: Four times a day (QID) | INTRAMUSCULAR | Status: DC | PRN

## 2016-05-18 MED ORDER — DIPHENHYDRAMINE HCL 25 MG PO CAPS: 25.0000 mg | ORAL_CAPSULE | Freq: Four times a day (QID) | ORAL | Status: DC | PRN

## 2016-05-18 MED ORDER — ZOLPIDEM TARTRATE 5 MG PO TABS: 5.0000 mg | ORAL_TABLET | Freq: Every evening | ORAL | Status: DC | PRN

## 2016-05-18 MED ORDER — ONDANSETRON 4 MG PO TBDP: 4.0000 mg | ORAL_TABLET | Freq: Four times a day (QID) | ORAL | Status: DC | PRN

## 2016-05-18 MED ORDER — CEFTRIAXONE SODIUM 2 G IJ SOLR
2.0000 g | INTRAMUSCULAR | Status: DC
  Administered 2016-05-18: 2 g via INTRAVENOUS
  Filled 2016-05-18: qty 2

## 2016-05-18 MED ORDER — ONDANSETRON HCL 4 MG/2ML IJ SOLN
4.0000 mg | Freq: Four times a day (QID) | INTRAMUSCULAR | Status: DC | PRN
  Administered 2016-05-19: 4 mg via INTRAVENOUS

## 2016-05-18 MED ORDER — MORPHINE SULFATE (PF) 2 MG/ML IV SOLN
2.0000 mg | INTRAVENOUS | Status: DC | PRN
  Administered 2016-05-18: 2 mg via INTRAVENOUS
  Administered 2016-05-19: 3 mg via INTRAVENOUS
  Filled 2016-05-18: qty 2
  Filled 2016-05-18: qty 1

## 2016-05-18 MED ORDER — ENOXAPARIN SODIUM 30 MG/0.3ML ~~LOC~~ SOLN
30.0000 mg | SUBCUTANEOUS | Status: DC
  Administered 2016-05-19 – 2016-05-21 (×2): 30 mg via SUBCUTANEOUS
  Filled 2016-05-18 (×2): qty 0.3

## 2016-05-18 NOTE — ED Notes (Signed)
PA-C at bedside 

## 2016-05-18 NOTE — ED Provider Notes (Signed)
CSN: [id number]     Arrival date & time 05/18/16  1428 History   First MD Initiated Contact with Patient 05/18/16 1732     Chief Complaint  Patient presents with  . Abdominal Pain     (Consider location/radiation/quality/duration/timing/severity/associated sxs/prior Treatment) HPI Comments: Patient presents today with complaints of right flank pain that is radiating to his right lower abdomen.  He states that he has been having this pain intermittently since yesterday.  Pain is sharp in nature.  He has not taken anything for the pain prior to arrival.  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  He denies nausea, vomiting, diarrhea, constipation, urinary symptoms, fever, or chills.  No history of kidney stones.    Patient is a 30 y.o. male presenting with abdominal pain. The history is provided by the patient.  Abdominal Pain   Past Medical History  Diagnosis Date  . Environmental allergies    History reviewed. No pertinent past surgical history. No family history on file. Social History  Substance Use Topics  . Smoking status: Former Smoker    Types: Cigars  . Smokeless tobacco: None  . Alcohol Use: No    Review of Systems  Gastrointestinal: Positive for abdominal pain.  All other systems reviewed and are negative.     Allergies  Review of patient's allergies indicates no known allergies.  Home Medications   Prior to Admission medications   Medication Sig Start Date End Date Taking? Authorizing Provider  Aspirin-Caffeine (BAYER BACK & BODY PO) Take 1 tablet by mouth daily as needed (pain).   Yes Historical Provider, MD  Multiple Vitamin (MULTIVITAMIN WITH MINERALS) TABS tablet Take 1 tablet by mouth daily. Men's One a Day   Yes Historical Provider, MD  Potassium 99 MG TABS Take 99 mg by mouth daily.   Yes Historical Provider, MD  cyclobenzaprine (FLEXERIL) 10 MG tablet Take 1 tablet (10 mg total) by mouth 2  (two) times daily as needed for muscle spasms. Patient not taking: Reported on 05/18/2016 09/15/12   Awilda Metro, NP  diclofenac (CATAFLAM) 50 MG tablet Take 1 tablet (50 mg total) by mouth 3 (three) times daily. With food prn back pain Patient not taking: Reported on 05/18/2016 10/09/13   Janne Napoleon, NP  HYDROcodone-acetaminophen (NORCO/VICODIN) 5-325 MG per tablet Take 2 tablets by mouth every 4 (four) hours as needed. Patient not taking: Reported on 05/18/2016 02/22/15   Alvina Chou, PA-C  naproxen (NAPROSYN) 500 MG tablet Take 1 tablet (500 mg total) by mouth 2 (two) times daily. Patient not taking: Reported on 05/18/2016 09/15/12   Awilda Metro, NP  potassium chloride SA (K-DUR,KLOR-CON) 20 MEQ tablet Take 1 tablet (20 mEq total) by mouth daily. Patient not taking: Reported on 05/18/2016 05/14/16   Elmyra Ricks Pisciotta, PA-C  traMADol (ULTRAM) 50 MG tablet Take 1 tablet (50 mg total) by mouth every 6 (six) hours as needed. Patient not taking: Reported on 05/18/2016 10/09/13   Janne Napoleon, NP   BP 127/79 mmHg  Pulse 71  Temp(Src) 98 F (36.7 C) (Oral)  Resp 18  Ht 5\' 7"  (1.702 m)  SpO2 99% Physical Exam  Constitutional: He appears well-developed and well-nourished.  HENT:  Head: Normocephalic and atraumatic.  Neck: Normal range of motion. Neck supple.  Cardiovascular: Normal rate, regular rhythm and normal heart sounds.   Pulmonary/Chest: Effort normal and breath sounds normal.  Abdominal: Soft. Bowel sounds are normal. He  exhibits no distension and no mass. There is tenderness in the right lower quadrant. There is CVA tenderness. There is no rebound and no guarding.  Right CVA tenderness to palpation  Musculoskeletal: Normal range of motion.  Neurological: He is alert.  Skin: Skin is warm and dry.  Psychiatric: He has a normal mood and affect.  Nursing note and vitals reviewed.   ED Course  Procedures (including critical care time) Labs Review Labs Reviewed  COMPREHENSIVE  METABOLIC PANEL - Abnormal; Notable for the following:    Glucose, Bld 124 (*)    BUN 22 (*)    Creatinine, Ser 2.56 (*)    Calcium 8.8 (*)    GFR calc non Af Amer 32 (*)    GFR calc Af Amer 37 (*)    All other components within normal limits  URINALYSIS, ROUTINE W REFLEX MICROSCOPIC (NOT AT [facility]) - Abnormal; Notable for the following:    APPearance CLOUDY (*)    Hgb urine dipstick TRACE (*)    Protein, ur 100 (*)    All other components within normal limits  URINE MICROSCOPIC-ADD ON - Abnormal; Notable for the following:    Squamous Epithelial / LPF 0-5 (*)    Bacteria, UA RARE (*)    All other components within normal limits  LIPASE, BLOOD  CBC    Imaging Review Ct Renal Stone Study  05/18/2016  CLINICAL DATA:  Evaluate for renal calculi.  Right flank pain. EXAM: CT ABDOMEN AND PELVIS WITHOUT CONTRAST TECHNIQUE: Multidetector CT imaging of the abdomen and pelvis was performed following the standard protocol without IV contrast. COMPARISON:  None FINDINGS: Lower chest: The lung bases are clear. No pleural or pericardial effusion noted. Hepatobiliary: No focal liver abnormality identified. The gallbladder appears normal. No biliary dilatation. Pancreas: No mass or inflammatory process identified on this un-enhanced exam. Spleen: Within normal limits in size. Adrenals/Urinary Tract: The adrenal glands are normal. No kidney stones or focal kidney abnormality identified on this unenhanced exam. No hydronephrosis or hydroureter. No bladder calculi identified. Stomach/Bowel: The stomach is normal. The small bowel loops have a normal course and caliber. There is no bowel obstruction identified. The appendix is visualized in the right lower quadrant. There is focal thickening within the midportion of the appendix which measures up to 1 cm. Appendicoliths are also noted. No surrounding fat stranding or free fluid. Vascular/Lymphatic: There is no ascites or focal fluid collections within the abdomen or  pelvis. Reproductive: No mass or other significant abnormality. Other: There is no ascites or focal fluid collections within the abdomen or pelvis. Musculoskeletal:  No aggressive lytic or sclerotic bone lesions. IMPRESSION: 1. There is no evidence for nephrolithiasis or hydronephrosis. 2. Focal increase diameter of the appendix which measures up to 10 mm. (Normal diameter of the appendix is considered 6 mm or less). Appendicoliths noted. No periappendiceal fat stranding, free fluid or fluid collections. Findings are equivocal for early appendicitis. Correlate for any clinical signs or symptoms of appendicitis. Electronically Signed   By: Kerby Moors M.D.   On: 05/18/2016 19:23   I have personally reviewed and evaluated these images and lab results as part of my medical decision-making.   EKG Interpretation None      MDM   Final diagnoses:  None   Patient presents today with complaints of right flank pain radiating to his right lower abdomen that has been present intermittently since yesterday.  Labs today showing AKI with a Creatine of 2.34.  Labs otherwise unremarkable.  UA negative for infection.  CT renal stone study ordered to evaluate for kidney stone and obstruction.  CT was negative for stone, but was equivocal for early appendicitis.  Patient given IVF.  Patient discussed with Dr. Gershon Crane with General Surgery who evaluated the patient and admitted for observation.      Hyman Bible, PA-C 05/20/16 [place], MD 05/22/16 1314

## 2016-05-18 NOTE — ED Notes (Signed)
Pt was recently here for dehydration and potassium was low. 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 Got some OTC potassium and has been taking it 2x a day and drinking gatorade. Now having abd pain and mid lower back pain.

## 2016-05-18 NOTE — H&P (Signed)
Dennis Harvey is an 30 y.o. male.   Chief Complaint: Lower abdominal pain HPI: This is a 30 yo male who presents with a one-day history of mid-abdominal pain that has now migrated more to the RLQ and R flank.  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  He presented to the ED for evaluation and was noted to have elevated creatinine.  A non-contrasted CT scan was performed to look for kidney stones.  This showed possible early appendicitis.  Past Medical History  Diagnosis Date  . Environmental allergies     History reviewed. No pertinent past surgical history.  No family history on file. Social History:  reports that he has quit smoking. His smoking use included Cigars. He does not have any smokeless tobacco history on file. He reports that he does not drink alcohol or use illicit drugs.  Allergies: No Known Allergies  Prior to Admission medications   Medication Sig Start Date End Date Taking? Authorizing Provider  Aspirin-Caffeine (BAYER BACK & BODY PO) Take 1 tablet by mouth daily as needed (pain).   Yes Historical Provider, MD  Multiple Vitamin (MULTIVITAMIN WITH MINERALS) TABS tablet Take 1 tablet by mouth daily. Men's One a Day   Yes Historical Provider, MD  Potassium 99 MG TABS Take 99 mg by mouth daily.   Yes Historical Provider, MD  cyclobenzaprine (FLEXERIL) 10 MG tablet Take 1 tablet (10 mg total) by mouth 2 (two) times daily as needed for muscle spasms. Patient not taking: Reported on 05/18/2016 09/15/12   Awilda Metro, NP  diclofenac (CATAFLAM) 50 MG tablet Take 1 tablet (50 mg total) by mouth 3 (three) times daily. With food prn back pain Patient not taking: Reported on 05/18/2016 10/09/13   Janne Napoleon, NP  HYDROcodone-acetaminophen (NORCO/VICODIN) 5-325 MG per tablet Take 2 tablets by mouth every 4 (four) hours as needed. Patient not taking: Reported on 05/18/2016 02/22/15   Alvina Chou, PA-C  naproxen (NAPROSYN) 500 MG tablet Take 1 tablet (500 mg total) by  mouth 2 (two) times daily. Patient not taking: Reported on 05/18/2016 09/15/12   Awilda Metro, NP  potassium chloride SA (K-DUR,KLOR-CON) 20 MEQ tablet Take 1 tablet (20 mEq total) by mouth daily. Patient not taking: Reported on 05/18/2016 05/14/16   Elmyra Ricks Pisciotta, PA-C  traMADol (ULTRAM) 50 MG tablet Take 1 tablet (50 mg total) by mouth every 6 (six) hours as needed. Patient not taking: Reported on 05/18/2016 10/09/13   Janne Napoleon, NP     Results for orders placed or performed during the hospital encounter of 05/18/16 (from the past 48 hour(s))  Lipase, blood     Status: None   Collection Time: 05/18/16  2:55 PM  Result Value Ref Range   Lipase 23 11 - 51 U/L  Comprehensive metabolic panel     Status: Abnormal   Collection Time: 05/18/16  2:55 PM  Result Value Ref Range   Sodium 139 135 - 145 mmol/L   Potassium 3.9 3.5 - 5.1 mmol/L   Chloride 105 101 - 111 mmol/L   CO2 27 22 - 32 mmol/L   Glucose, Bld 124 (H) 65 - 99 mg/dL   BUN 22 (H) 6 - 20 mg/dL   Creatinine, Ser 2.56 (H) 0.61 - 1.24 mg/dL   Calcium 8.8 (L) 8.9 - 10.3 mg/dL   Total Protein 7.8 6.5 - 8.1 g/dL   Albumin 3.9 3.5 - 5.0 g/dL   AST 25 15 - 41 U/L   ALT 23  17 - 63 U/L   Alkaline Phosphatase 55 38 - 126 U/L   Total Bilirubin 1.0 0.3 - 1.2 mg/dL   GFR calc non Af Amer 32 (L) >60 mL/min   GFR calc Af Amer 37 (L) >60 mL/min    Comment: (NOTE) The eGFR has been calculated using the CKD EPI equation. This calculation has not been validated in all clinical situations. eGFR's persistently <60 mL/min signify possible Chronic Kidney Disease.    Anion gap 7 5 - 15  CBC     Status: None   Collection Time: 05/18/16  2:55 PM  Result Value Ref Range   WBC 9.6 4.0 - 10.5 K/uL   RBC 4.71 4.22 - 5.81 MIL/uL   Hemoglobin 13.9 13.0 - 17.0 g/dL   HCT 42.4 39.0 - 52.0 %   MCV 90.0 78.0 - 100.0 fL   MCH 29.5 26.0 - 34.0 pg   MCHC 32.8 30.0 - 36.0 g/dL   RDW 12.9 11.5 - 15.5 %   Platelets 290 150 - 400 K/uL  Urinalysis,  Routine w reflex microscopic     Status: Abnormal   Collection Time: 05/18/16  4:22 PM  Result Value Ref Range   Color, Urine YELLOW YELLOW   APPearance CLOUDY (A) CLEAR   Specific Gravity, Urine 1.014 1.005 - 1.030   pH 5.5 5.0 - 8.0   Glucose, UA NEGATIVE NEGATIVE mg/dL   Hgb urine dipstick TRACE (A) NEGATIVE   Bilirubin Urine NEGATIVE NEGATIVE   Ketones, ur NEGATIVE NEGATIVE mg/dL   Protein, ur 100 (A) NEGATIVE mg/dL   Nitrite NEGATIVE NEGATIVE   Leukocytes, UA NEGATIVE NEGATIVE  Urine microscopic-add on     Status: Abnormal   Collection Time: 05/18/16  4:22 PM  Result Value Ref Range   Squamous Epithelial / LPF 0-5 (A) NONE SEEN   WBC, UA 0-5 0 - 5 WBC/hpf   RBC / HPF NONE SEEN 0 - 5 RBC/hpf   Bacteria, UA RARE (A) NONE SEEN   Ct Renal Stone Study  05/18/2016  CLINICAL DATA:  Evaluate for renal calculi.  Right flank pain. EXAM: CT ABDOMEN AND PELVIS WITHOUT CONTRAST TECHNIQUE: Multidetector CT imaging of the abdomen and pelvis was performed following the standard protocol without IV contrast. COMPARISON:  None FINDINGS: Lower chest: The lung bases are clear. No pleural or pericardial effusion noted. Hepatobiliary: No focal liver abnormality identified. The gallbladder appears normal. No biliary dilatation. Pancreas: No mass or inflammatory process identified on this un-enhanced exam. Spleen: Within normal limits in size. Adrenals/Urinary Tract: The adrenal glands are normal. No kidney stones or focal kidney abnormality identified on this unenhanced exam. No hydronephrosis or hydroureter. No bladder calculi identified. Stomach/Bowel: The stomach is normal. The small bowel loops have a normal course and caliber. There is no bowel obstruction identified. The appendix is visualized in the right lower quadrant. There is focal thickening within the midportion of the appendix which measures up to 1 cm. Appendicoliths are also noted. No surrounding fat stranding or free fluid. Vascular/Lymphatic:  There is no ascites or focal fluid collections within the abdomen or pelvis. Reproductive: No mass or other significant abnormality. Other: There is no ascites or focal fluid collections within the abdomen or pelvis. Musculoskeletal:  No aggressive lytic or sclerotic bone lesions. IMPRESSION: 1. There is no evidence for nephrolithiasis or hydronephrosis. 2. Focal increase diameter of the appendix which measures up to 10 mm. (Normal diameter of the appendix is considered 6 mm or less). Appendicoliths noted. No periappendiceal  fat stranding, free fluid or fluid collections. Findings are equivocal for early appendicitis. Correlate for any clinical signs or symptoms of appendicitis. Electronically Signed   By: Kerby Moors M.D.   On: 05/18/2016 19:23    Review of Systems  Constitutional: Negative for weight loss.  HENT: Negative for ear discharge, ear pain, hearing loss and tinnitus.   Eyes: Negative for blurred vision, double vision, photophobia and pain.  Respiratory: Negative for cough, sputum production and shortness of breath.   Cardiovascular: Negative for chest pain.  Gastrointestinal: Positive for nausea and abdominal pain. Negative for vomiting.  Genitourinary: Negative for dysuria, urgency, frequency and flank pain.  Musculoskeletal: Negative for myalgias, back pain, joint pain, falls and neck pain.  Neurological: Negative for dizziness, tingling, sensory change, focal weakness, loss of consciousness and headaches.  Endo/Heme/Allergies: Does not bruise/bleed easily.  Psychiatric/Behavioral: Negative for depression, memory loss and substance abuse. The patient is not nervous/anxious.     Blood pressure 127/79, pulse 71, temperature 98 F (36.7 C), temperature source Oral, resp. rate 18, height 5' 7" (1.702 m), SpO2 99 %. Physical Exam  WDWN in NAD HEENT:  EOMI, sclera anicteric Neck:  No masses, no thyromegaly Lungs:  CTA bilaterally; normal respiratory effort CV:  Regular rate and  rhythm; no murmurs Abd:  +bowel sounds, soft, tender in RLQ and periumbilical; no masses; no rebound; no guarding Ext:  Well-perfused; no edema Skin:  Warm, dry; no sign of jaundice  Assessment/Plan 1.  Renal dysfunction - likely secondary to hypovolemia 2.  Possible early acute appendicitis  Admit to hospital for IV hydration NPO p MN Will reassess in AM - if still tender will consider laparoscopic appendectomy.    Maia Petties., MD 05/18/2016, 8:17 PM

## 2016-05-19 DIAGNOSIS — R109 Unspecified abdominal pain: Secondary | ICD-10-CM | POA: Diagnosis present

## 2016-05-19 DIAGNOSIS — E861 Hypovolemia: Secondary | ICD-10-CM | POA: Diagnosis present

## 2016-05-19 DIAGNOSIS — Z833 Family history of diabetes mellitus: Secondary | ICD-10-CM | POA: Diagnosis not present

## 2016-05-19 DIAGNOSIS — N170 Acute kidney failure with tubular necrosis: Secondary | ICD-10-CM | POA: Diagnosis present

## 2016-05-19 DIAGNOSIS — Z7982 Long term (current) use of aspirin: Secondary | ICD-10-CM | POA: Diagnosis not present

## 2016-05-19 DIAGNOSIS — R739 Hyperglycemia, unspecified: Secondary | ICD-10-CM | POA: Diagnosis present

## 2016-05-19 DIAGNOSIS — Z79899 Other long term (current) drug therapy: Secondary | ICD-10-CM | POA: Diagnosis not present

## 2016-05-19 DIAGNOSIS — Z87891 Personal history of nicotine dependence: Secondary | ICD-10-CM | POA: Diagnosis not present

## 2016-05-19 LAB — CBC
HEMATOCRIT: 41 % (ref 39.0–52.0)
Hemoglobin: 12.8 g/dL — ABNORMAL LOW (ref 13.0–17.0)
MCH: 28.4 pg (ref 26.0–34.0)
MCHC: 31.2 g/dL (ref 30.0–36.0)
MCV: 90.9 fL (ref 78.0–100.0)
PLATELETS: 259 10*3/uL (ref 150–400)
RBC: 4.51 MIL/uL (ref 4.22–5.81)
RDW: 13 % (ref 11.5–15.5)
WBC: 9 10*3/uL (ref 4.0–10.5)

## 2016-05-19 LAB — COMPREHENSIVE METABOLIC PANEL
ALBUMIN: 3 g/dL — AB (ref 3.5–5.0)
ALK PHOS: 49 U/L (ref 38–126)
ALT: 19 U/L (ref 17–63)
AST: 18 U/L (ref 15–41)
Anion gap: 10 (ref 5–15)
BILIRUBIN TOTAL: 0.7 mg/dL (ref 0.3–1.2)
BUN: 21 mg/dL — AB (ref 6–20)
CO2: 24 mmol/L (ref 22–32)
CREATININE: 2.74 mg/dL — AB (ref 0.61–1.24)
Calcium: 8.3 mg/dL — ABNORMAL LOW (ref 8.9–10.3)
Chloride: 111 mmol/L (ref 101–111)
GFR calc Af Amer: 34 mL/min — ABNORMAL LOW (ref >60)
GFR calc non Af Amer: 29 mL/min — ABNORMAL LOW (ref >60)
GLUCOSE: 101 mg/dL — AB (ref 65–99)
POTASSIUM: 4 mmol/L (ref 3.5–5.1)
Sodium: 145 mmol/L (ref 135–145)
TOTAL PROTEIN: 6.4 g/dL — AB (ref 6.5–8.1)

## 2016-05-19 LAB — SODIUM, URINE, RANDOM: SODIUM UR: 37 mmol/L

## 2016-05-19 LAB — PROTEIN / CREATININE RATIO, URINE
Creatinine, Urine: 57.03 mg/dL
PROTEIN CREATININE RATIO: 0.28 mg/mg{creat} — AB (ref 0.00–0.15)
TOTAL PROTEIN, URINE: 16 mg/dL

## 2016-05-19 LAB — URINE MICROSCOPIC-ADD ON

## 2016-05-19 LAB — URINALYSIS, ROUTINE W REFLEX MICROSCOPIC
Bilirubin Urine: NEGATIVE
GLUCOSE, UA: NEGATIVE mg/dL
KETONES UR: NEGATIVE mg/dL
LEUKOCYTES UA: NEGATIVE
NITRITE: NEGATIVE
PROTEIN: NEGATIVE mg/dL
Specific Gravity, Urine: 1.011 (ref 1.005–1.030)
pH: 5.5 (ref 5.0–8.0)

## 2016-05-19 LAB — CK: Total CK: 200 U/L (ref 49–397)

## 2016-05-19 LAB — CREATININE, URINE, RANDOM: Creatinine, Urine: 55.8 mg/dL

## 2016-05-19 MED ORDER — BISACODYL 10 MG RE SUPP
10.0000 mg | Freq: Every day | RECTAL | Status: DC | PRN
  Filled 2016-05-19: qty 1

## 2016-05-19 MED ORDER — DOCUSATE SODIUM 100 MG PO CAPS
100.0000 mg | ORAL_CAPSULE | Freq: Two times a day (BID) | ORAL | Status: DC
  Administered 2016-05-20 – 2016-05-21 (×2): 100 mg via ORAL
  Filled 2016-05-19 (×5): qty 1

## 2016-05-19 MED ORDER — MORPHINE SULFATE (PF) 2 MG/ML IV SOLN
2.0000 mg | INTRAVENOUS | Status: DC | PRN
  Administered 2016-05-19: 2 mg via INTRAVENOUS
  Filled 2016-05-19: qty 1

## 2016-05-19 MED ORDER — TRAMADOL HCL 50 MG PO TABS: 50.0000 mg | ORAL_TABLET | Freq: Four times a day (QID) | ORAL | Status: DC | PRN

## 2016-05-19 NOTE — Progress Notes (Addendum)
[facility] Progress Note    Subjective: Patient essentially has no more abdominal pain.  Wants to eat.  Objective: Vital signs in last 24 hours: Temp:  [98 F (36.7 C)-98.4 F (36.9 C)] 98.4 F (36.9 C) (07/15 0456) Pulse Rate:  [63-98] 64 (07/15 0456) Resp:  [17-19] 17 (07/15 0456) BP: (112-140)/(54-79) 112/61 mmHg (07/15 0456) SpO2:  [94 %-100 %] 97 % (07/15 0456) Weight:  [113.036 kg (249 lb 3.2 oz)] 113.036 kg (249 lb 3.2 oz) (07/14 2137) Last BM Date: 05/17/16  Intake/Output from previous day: 07/14 0701 - 07/15 0700 In: 981.3 [I.V.:981.3] Out: -  Intake/Output this shift:    General: No distress.  Thirsty.  Lungs: clear  Abd: Excellent bowel sounds.  No RLQ tenderness.    Extremities: No clinical signs or symptoms of DVT  Neuro: Intact  Lab Results:  @LABLAST2 (wbc:2,hgb:2,hct:2,plt:2) BMET ) Recent Labs  05/18/16 1455 05/19/16 0416  NA 139 145  K 3.9 4.0  CL 105 111  CO2 27 24  GLUCOSE 124* 101*  BUN 22* 21*  CREATININE 2.56* 2.74*  CALCIUM 8.8* 8.3*   PT/INR No results for input(s): LABPROT, INR in the last 72 hours. ABG No results for input(s): PHART, HCO3 in the last 72 hours.  Invalid input(s): PCO2, PO2  Studies/Results: Ct Renal Stone Study  05/18/2016  CLINICAL DATA:  Evaluate for renal calculi.  Right flank pain. EXAM: CT ABDOMEN AND PELVIS WITHOUT CONTRAST TECHNIQUE: Multidetector CT imaging of the abdomen and pelvis was performed following the standard protocol without IV contrast. COMPARISON:  None FINDINGS: Lower chest: The lung bases are clear. No pleural or pericardial effusion noted. Hepatobiliary: No focal liver abnormality identified. The gallbladder appears normal. No biliary dilatation. Pancreas: No mass or inflammatory process identified on this un-enhanced exam. Spleen: Within normal limits in size. Adrenals/Urinary Tract: The adrenal glands are normal. No kidney stones or focal kidney abnormality identified on this unenhanced  exam. No hydronephrosis or hydroureter. No bladder calculi identified. Stomach/Bowel: The stomach is normal. The small bowel loops have a normal course and caliber. There is no bowel obstruction identified. The appendix is visualized in the right lower quadrant. There is focal thickening within the midportion of the appendix which measures up to 1 cm. Appendicoliths are also noted. No surrounding fat stranding or free fluid. Vascular/Lymphatic: There is no ascites or focal fluid collections within the abdomen or pelvis. Reproductive: No mass or other significant abnormality. Other: There is no ascites or focal fluid collections within the abdomen or pelvis. Musculoskeletal:  No aggressive lytic or sclerotic bone lesions. IMPRESSION: 1. There is no evidence for nephrolithiasis or hydronephrosis. 2. Focal increase diameter of the appendix which measures up to 10 mm. (Normal diameter of the appendix is considered 6 mm or less). Appendicoliths noted. No periappendiceal fat stranding, free fluid or fluid collections. Findings are equivocal for early appendicitis. Correlate for any clinical signs or symptoms of appendicitis. Electronically Signed   By: Kerby Moors M.D.   On: 05/18/2016 19:23    Anti-infectives: Anti-infectives    Start     Dose/Rate Route Frequency Ordered Stop   05/18/16 2133  cefTRIAXone (ROCEPHIN) 2 g in dextrose 5 % 50 mL IVPB     2 g 100 mL/hr over 30 Minutes Intravenous Every 24 hours 05/18/16 2134     05/18/16 2133  metroNIDAZOLE (FLAGYL) IVPB 500 mg     500 mg 100 mL/hr over 60 Minutes Intravenous Every 8 hours 05/18/16 2134  Assessment/Plan: s/p  Advance diet Consider renal consultation.  I do not believe that the patient has acute appendicitis    Kathryne Eriksson. Dahlia Bailiff, MD, FACS [phone] [facility] Surgery 05/19/2016

## 2016-05-19 NOTE — Consult Note (Signed)
Reason for Consult: ARF Referring Physician: Hulen Skains, MD  Dennis Harvey is an 30 y.o. male.  HPI: Dennis Harvey is a 30 yo AAM without significant PMH who presented to [facility] on 05/14/16 with SSCP treated with toradol and subsequently returned on 05/18/16 with abdominal pain associated with nausea and anorexia.  Initial evaluation revealed Scr of 2.56 (up from 1.23 4 days prior).  He denies any dysuria, pyuria, hematuria, foamy urine, NSAIDs (other than bayer back and body daily), or family history of kidney disease. He also denies any swollen painful joints, rashes, or oral lesions. No IV contrasted materials nor kidney stones.   Trend in Creatinine: CREATININE, SER  Date/Time Value Ref Range Status  05/19/2016 04:16 AM 2.74* 0.61 - 1.24 mg/dL Final  05/18/2016 02:55 PM 2.56* 0.61 - 1.24 mg/dL Final  05/14/2016 05:47 PM 1.23 0.61 - 1.24 mg/dL Final    PMH:   Past Medical History  Diagnosis Date  . Environmental allergies     PSH:  History reviewed. No pertinent past surgical history.  Allergies: No Known Allergies  Medications:   Prior to Admission medications   Medication Sig Start Date End Date Taking? Authorizing Provider  Aspirin-Caffeine (BAYER BACK & BODY PO) Take 1 tablet by mouth daily as needed (pain).   Yes Historical Provider, MD  Multiple Vitamin (MULTIVITAMIN WITH MINERALS) TABS tablet Take 1 tablet by mouth daily. Men's One a Day   Yes Historical Provider, MD  Potassium 99 MG TABS Take 99 mg by mouth daily.   Yes Historical Provider, MD  cyclobenzaprine (FLEXERIL) 10 MG tablet Take 1 tablet (10 mg total) by mouth 2 (two) times daily as needed for muscle spasms. Patient not taking: Reported on 05/18/2016 09/15/12   Awilda Metro, NP  diclofenac (CATAFLAM) 50 MG tablet Take 1 tablet (50 mg total) by mouth 3 (three) times daily. With food prn back pain Patient not taking: Reported on 05/18/2016 10/09/13   Janne Napoleon, NP  HYDROcodone-acetaminophen (NORCO/VICODIN) 5-325 MG  per tablet Take 2 tablets by mouth every 4 (four) hours as needed. Patient not taking: Reported on 05/18/2016 02/22/15   Alvina Chou, PA-C  naproxen (NAPROSYN) 500 MG tablet Take 1 tablet (500 mg total) by mouth 2 (two) times daily. Patient not taking: Reported on 05/18/2016 09/15/12   Awilda Metro, NP  potassium chloride SA (K-DUR,KLOR-CON) 20 MEQ tablet Take 1 tablet (20 mEq total) by mouth daily. Patient not taking: Reported on 05/18/2016 05/14/16   Elmyra Ricks Pisciotta, PA-C  traMADol (ULTRAM) 50 MG tablet Take 1 tablet (50 mg total) by mouth every 6 (six) hours as needed. Patient not taking: Reported on 05/18/2016 10/09/13   Janne Napoleon, NP    Inpatient medications: . enoxaparin (LOVENOX) injection  30 mg Subcutaneous Q24H    Discontinued Meds:   Medications Discontinued During This Encounter  Medication Reason  . cephALEXin (KEFLEX) 500 MG capsule Completed Course  . cefTRIAXone (ROCEPHIN) 2 g in dextrose 5 % 50 mL IVPB   . metroNIDAZOLE (FLAGYL) IVPB 500 mg   . morphine 2 MG/ML injection 2-4 mg     Social History:  reports that he has quit smoking. His smoking use included Cigars. He does not have any smokeless tobacco history on file. He reports that he does not drink alcohol or use illicit drugs.  Family History:  No family history on file.  Pertinent items are noted in HPI. Weight change:   Intake/Output Summary (Last 24 hours) at 05/19/16 0825 Last data filed at 05/19/16  0800  Gross per 24 hour  Intake 1221.25 ml  Output    375 ml  Net 846.25 ml   BP 112/61 mmHg  Pulse 64  Temp(Src) 98.4 F (36.9 C) (Oral)  Resp 17  Ht 5\' 7"  (1.702 m)  Wt 113.036 kg (249 lb 3.2 oz)  BMI 39.02 kg/m2  SpO2 97% Filed Vitals:   05/18/16 2030 05/18/16 2045 05/18/16 2137 05/19/16 0456  BP: 117/55 114/54 131/70 112/61  Pulse: 71 82 98 64  Temp:   98.4 F (36.9 C) 98.4 F (36.9 C)  TempSrc:   Oral Oral  Resp:  19 18 17   Height:   5\' 7"  (1.702 m)   Weight:   113.036 kg (249 lb  3.2 oz)   SpO2: 98% 94% 100% 97%     General appearance: alert, cooperative and no distress Head: atraumatic, multiple cysts on scalp Eyes: negative findings: lids and lashes normal, conjunctivae and sclerae normal and corneas clear Neck: no adenopathy, no carotid bruit, no JVD, supple, symmetrical, trachea midline and thyroid not enlarged, symmetric, no tenderness/mass/nodules Resp: clear to auscultation bilaterally Cardio: regular rate and rhythm, S1, S2 normal, no murmur, click, rub or gallop GI: soft, non-tender; bowel sounds normal; no masses,  no organomegaly Extremities: extremities normal, atraumatic, no cyanosis or edema  Labs: Basic Metabolic Panel:  Recent Labs Lab 05/14/16 1747 05/18/16 1455 05/19/16 0416  NA 138 139 145  K 3.2* 3.9 4.0  CL 105 105 111  CO2 27 27 24   GLUCOSE 111* 124* 101*  BUN 9 22* 21*  CREATININE 1.23 2.56* 2.74*  ALBUMIN  --  3.9 3.0*  CALCIUM 9.7 8.8* 8.3*   Liver Function Tests:  Recent Labs Lab 05/18/16 1455 05/19/16 0416  AST 25 18  ALT 23 19  ALKPHOS 55 49  BILITOT 1.0 0.7  PROT 7.8 6.4*  ALBUMIN 3.9 3.0*    Recent Labs Lab 05/18/16 1455  LIPASE 23   No results for input(s): AMMONIA in the last 168 hours. CBC:  Recent Labs Lab 05/14/16 1747 05/18/16 1455 05/19/16 0416  WBC 7.2 9.6 9.0  HGB 15.8 13.9 12.8*  HCT 48.8 42.4 41.0  MCV 91.2 90.0 90.9  PLT 312 290 259   PT/INR: @LABRCNTIP (inr:5) Cardiac Enzymes: )No results for input(s): CKTOTAL, CKMB, CKMBINDEX, TROPONINI in the last 168 hours. CBG: No results for input(s): GLUCAP in the last 168 hours.  Iron Studies: No results for input(s): IRON, TIBC, TRANSFERRIN, FERRITIN in the last 168 hours.  Xrays/Other Studies: Ct Renal Stone Study  05/18/2016  CLINICAL DATA:  Evaluate for renal calculi.  Right flank pain. EXAM: CT ABDOMEN AND PELVIS WITHOUT CONTRAST TECHNIQUE: Multidetector CT imaging of the abdomen and pelvis was performed following the standard  protocol without IV contrast. COMPARISON:  None FINDINGS: Lower chest: The lung bases are clear. No pleural or pericardial effusion noted. Hepatobiliary: No focal liver abnormality identified. The gallbladder appears normal. No biliary dilatation. Pancreas: No mass or inflammatory process identified on this un-enhanced exam. Spleen: Within normal limits in size. Adrenals/Urinary Tract: The adrenal glands are normal. No kidney stones or focal kidney abnormality identified on this unenhanced exam. No hydronephrosis or hydroureter. No bladder calculi identified. Stomach/Bowel: The stomach is normal. The small bowel loops have a normal course and caliber. There is no bowel obstruction identified. The appendix is visualized in the right lower quadrant. There is focal thickening within the midportion of the appendix which measures up to 1 cm. Appendicoliths are also noted. No surrounding  fat stranding or free fluid. Vascular/Lymphatic: There is no ascites or focal fluid collections within the abdomen or pelvis. Reproductive: No mass or other significant abnormality. Other: There is no ascites or focal fluid collections within the abdomen or pelvis. Musculoskeletal:  No aggressive lytic or sclerotic bone lesions. IMPRESSION: 1. There is no evidence for nephrolithiasis or hydronephrosis. 2. Focal increase diameter of the appendix which measures up to 10 mm. (Normal diameter of the appendix is considered 6 mm or less). Appendicoliths noted. No periappendiceal fat stranding, free fluid or fluid collections. Findings are equivocal for early appendicitis. Correlate for any clinical signs or symptoms of appendicitis. Electronically Signed   By: Kerby Moors M.D.   On: 05/18/2016 19:23     Assessment/Plan: 1.  ARF- unclear etiology.  Possibly ischemic ATN, however acute GN is a concern.  Serologies pending.  CT scan without evidence of obstruction.  No uremic symptoms.  No nephrotoxic agents identified.  Workup underway.   He may require renal biopsy if his renal function continues to worsen. 2. Abdominal pain- CT revealed increased diameter of appendix concerning for appendicitis.  Surgery following. 3. Hyperglycemia- may be new diagnosis of diabetes mellitus type 2 4.    Dennis Harvey 05/19/2016, 8:25 AM

## 2016-05-20 ENCOUNTER — Encounter (HOSPITAL_COMMUNITY): Payer: Self-pay | Admitting: Physician Assistant

## 2016-05-20 DIAGNOSIS — R739 Hyperglycemia, unspecified: Secondary | ICD-10-CM

## 2016-05-20 DIAGNOSIS — N179 Acute kidney failure, unspecified: Secondary | ICD-10-CM

## 2016-05-20 LAB — C3 COMPLEMENT: C3 Complement: 155 mg/dL (ref 82–167)

## 2016-05-20 LAB — MPO/PR-3 (ANCA) ANTIBODIES
ANCA Proteinase 3: <3.5 U/mL (ref 0.0–3.5)
Myeloperoxidase Abs: <9 U/mL (ref 0.0–9.0)

## 2016-05-20 LAB — RENAL FUNCTION PANEL
Albumin: 2.9 g/dL — ABNORMAL LOW (ref 3.5–5.0)
Anion gap: 6 (ref 5–15)
BUN: 22 mg/dL — AB (ref 6–20)
CHLORIDE: 113 mmol/L — AB (ref 101–111)
CO2: 23 mmol/L (ref 22–32)
Calcium: 8.4 mg/dL — ABNORMAL LOW (ref 8.9–10.3)
Creatinine, Ser: 2.61 mg/dL — ABNORMAL HIGH (ref 0.61–1.24)
GFR calc Af Amer: 36 mL/min — ABNORMAL LOW (ref >60)
GFR calc non Af Amer: 31 mL/min — ABNORMAL LOW (ref >60)
GLUCOSE: 90 mg/dL (ref 65–99)
POTASSIUM: 4.2 mmol/L (ref 3.5–5.1)
Phosphorus: 3.4 mg/dL (ref 2.5–4.6)
Sodium: 142 mmol/L (ref 135–145)

## 2016-05-20 LAB — HEPATITIS PANEL, ACUTE
HEP A IGM: NEGATIVE
Hep B C IgM: NEGATIVE
Hepatitis B Surface Ag: NEGATIVE

## 2016-05-20 LAB — COMPLEMENT, TOTAL: COMPL TOTAL (CH50): 55 U/mL (ref 42–60)

## 2016-05-20 LAB — C4 COMPLEMENT: Complement C4, Body Fluid: 29 mg/dL (ref 14–44)

## 2016-05-20 LAB — ANTISTREPTOLYSIN O TITER: ASO: 25 [IU]/mL (ref 0.0–200.0)

## 2016-05-20 NOTE — Progress Notes (Signed)
Patient ID: Dennis Harvey, male   DOB: [birth year], 30 y.o.   MRN: [id number]  S:feels better O:BP 112/44 mmHg  Pulse 48  Temp(Src) 98.3 F (36.8 C) (Oral)  Resp 16  Ht 5\' 7"  (1.702 m)  Wt 113.036 kg (249 lb 3.2 oz)  BMI 39.02 kg/m2  SpO2 99%  Intake/Output Summary (Last 24 hours) at 05/20/16 1008 Last data filed at 05/20/16 0830  Gross per 24 hour  Intake    840 ml  Output   1175 ml  Net   -335 ml   Intake/Output: I/O last 3 completed shifts: In: 2061.3 [P.O.:1080; I.V.:981.3] Out: 1100 [Urine:1100]  Intake/Output this shift:  Total I/O In: -  Out: 450 [Urine:450] Weight change:  Gen:WD WN AAM in NAD CVS:no rub Resp:cta [id number] Ext:no edema   Recent Labs Lab 05/14/16 1747 05/18/16 1455 05/19/16 0416 05/20/16 0404  NA 138 139 145 142  K 3.2* 3.9 4.0 4.2  CL 105 105 111 113*  CO2 27 27 24 23   GLUCOSE 111* 124* 101* 90  BUN 9 22* 21* 22*  CREATININE 1.23 2.56* 2.74* 2.61*  ALBUMIN  --  3.9 3.0* 2.9*  CALCIUM 9.7 8.8* 8.3* 8.4*  PHOS  --   --   --  3.4  AST  --  25 18  --   ALT  --  23 19  --    Liver Function Tests:  Recent Labs Lab 05/18/16 1455 05/19/16 0416 05/20/16 0404  AST 25 18  --   ALT 23 19  --   ALKPHOS 55 49  --   BILITOT 1.0 0.7  --   PROT 7.8 6.4*  --   ALBUMIN 3.9 3.0* 2.9*    Recent Labs Lab 05/18/16 1455  LIPASE 23   No results for input(s): AMMONIA in the last 168 hours. CBC:  Recent Labs Lab 05/14/16 1747 05/18/16 1455 05/19/16 0416  WBC 7.2 9.6 9.0  HGB 15.8 13.9 12.8*  HCT 48.8 42.4 41.0  MCV 91.2 90.0 90.9  PLT 312 290 259   Cardiac Enzymes:  Recent Labs Lab 05/19/16 1003  CKTOTAL 200   CBG: No results for input(s): GLUCAP in the last 168 hours.  Iron Studies: No results for input(s): IRON, TIBC, TRANSFERRIN, FERRITIN in the last 72 hours. Studies/Results: Ct Renal Stone Study  05/18/2016  CLINICAL DATA:  Evaluate for renal calculi.  Right flank pain. EXAM: CT ABDOMEN AND PELVIS WITHOUT  CONTRAST TECHNIQUE: Multidetector CT imaging of the abdomen and pelvis was performed following the standard protocol without IV contrast. COMPARISON:  None FINDINGS: Lower chest: The lung bases are clear. No pleural or pericardial effusion noted. Hepatobiliary: No focal liver abnormality identified. The gallbladder appears normal. No biliary dilatation. Pancreas: No mass or inflammatory process identified on this un-enhanced exam. Spleen: Within normal limits in size. Adrenals/Urinary Tract: The adrenal glands are normal. No kidney stones or focal kidney abnormality identified on this unenhanced exam. No hydronephrosis or hydroureter. No bladder calculi identified. Stomach/Bowel: The stomach is normal. The small bowel loops have a normal course and caliber. There is no bowel obstruction identified. The appendix is visualized in the right lower quadrant. There is focal thickening within the midportion of the appendix which measures up to 1 cm. Appendicoliths are also noted. No surrounding fat stranding or free fluid. Vascular/Lymphatic: There is no ascites or focal fluid collections within the abdomen or pelvis. Reproductive: No mass or other significant abnormality. Other: There is no ascites or focal fluid collections within  the abdomen or pelvis. Musculoskeletal:  No aggressive lytic or sclerotic bone lesions. IMPRESSION: 1. There is no evidence for nephrolithiasis or hydronephrosis. 2. Focal increase diameter of the appendix which measures up to 10 mm. (Normal diameter of the appendix is considered 6 mm or less). Appendicoliths noted. No periappendiceal fat stranding, free fluid or fluid collections. Findings are equivocal for early appendicitis. Correlate for any clinical signs or symptoms of appendicitis. Electronically Signed   By: Kerby Moors M.D.   On: 05/18/2016 19:23   . docusate sodium  100 mg Oral BID  . enoxaparin (LOVENOX) injection  30 mg Subcutaneous Q24H    BMET    Component Value  Date/Time   NA 142 05/20/2016 0404   K 4.2 05/20/2016 0404   CL 113* 05/20/2016 0404   CO2 23 05/20/2016 0404   GLUCOSE 90 05/20/2016 0404   BUN 22* 05/20/2016 0404   CREATININE 2.61* 05/20/2016 0404   CALCIUM 8.4* 05/20/2016 0404   GFRNONAA 31* 05/20/2016 0404   GFRAA 36* 05/20/2016 0404   CBC    Component Value Date/Time   WBC 9.0 05/19/2016 0416   RBC 4.51 05/19/2016 0416   HGB 12.8* 05/19/2016 0416   HCT 41.0 05/19/2016 0416   PLT 259 05/19/2016 0416   MCV 90.9 05/19/2016 0416   MCH 28.4 05/19/2016 0416   MCHC 31.2 05/19/2016 0416   RDW 13.0 05/19/2016 0416     Assessment/Plan: 1. ARF- unclear etiology. Possibly ischemic ATN, however acute GN is a concern. Serologies pending. CT scan without evidence of obstruction. No uremic symptoms. No nephrotoxic agents identified. Workup underway. He may require renal biopsy if his renal function continues to worsen or does not improve. 2. Abdominal pain- CT revealed increased diameter of appendix concerning for appendicitis. Surgery following. 3. Hyperglycemia- may be new diagnosis of diabetes mellitus type 2  [name] Rashelle Ireland

## 2016-05-20 NOTE — Consult Note (Signed)
Medical Consultation   Dennis Harvey  AXE:[id number]  DOB: 1986-01-02  DOA: 05/18/2016  PCP: Webb Silversmith, NP    Requesting physician: Dalbert Batman, MD ([facility])  Reason for consultation:  Renal insufficieny     History of Present Illness: Dennis Harvey is an 30 y.o. male with no significant prior medical history, admitted on 05/18/2016 with right lower quadrant and right flank pain.  This was preceded by a visit to the [place] 7/10 for diffuse anterior chest pain with negative workup. At that time, the patient had been informed that he had low potassium, prescribed K dur. He also took Gatorade, with minimal water intake, which she reports may have exacerbated his symptoms during this visit. At the time of presentation, he had no cell, decreased by mouth intake, and labs reported the serum creatinine of 2.56 (up from 1.23 4 days prior). He denies any dysuria, pyuria, or hematuria. He does report some foamy urine. He also reports polyuria.He takes intermittently NSAIDs for back pain. He denies any painful joints, rashes or oral lesions. He denies any history of IV contrast or kidney stones. Patient does eat salty and fried foods. He also craves sweets. Surgical evaluation was obtained as a CT scan was suspicious for appendicitis, although surgery has concluded that clinically and per imaging this is unlikely,ssessment symptoms have resolved. However, due to his renal failure, with creatinine not improving, nephrology was called for consultation. CT renal negative for nephrolithiasis or hydronephrosis. At this point, his acute renal failure is of unclear etiology, possibly ischemic ATN, however, there is a concern for glomerulonephritis. Serologies are pending.  In addition he was found to be hyperglycemic and workup is under way. The patient does have strong family history of diabetes in both maternal and paternal side. We were consulted to help in the management of his primary medical  issues Current Cr 2.61. Glu 90 (from 124 on admission)   Review of Systems:  As per HPI otherwise 10 point review of systems negative.     Past Medical History: Past Medical History  Diagnosis Date  . Environmental allergies     Past Surgical History: History reviewed. No pertinent past surgical history.   Allergies:  No Known Allergies   Social History: Social History   Social History  . Marital Status: Single    Spouse Name: N/A  . Number of Children: N/A  . Years of Education: N/A   Occupational History  . Not on file.   Social History Main Topics  . Smoking status: Former Smoker    Types: Cigars  . Smokeless tobacco: Not on file  . Alcohol Use: No  . Drug Use: No  . Sexual Activity: Yes    Birth Control/ Protection: None   Other Topics Concern  . Not on file   Social History Narrative       Family History: No family history on file.  Family history reviewed and not pertinent    Physical Exam: Filed Vitals:   05/19/16 0456 05/19/16 1425 05/19/16 2200 05/20/16 0507  BP: 112/61 122/66 118/62 112/44  Pulse: 64 57 52 48  Temp: 98.4 F (36.9 C) 98 F (36.7 C) 98.9 F (37.2 C) 98.3 F (36.8 C)  TempSrc: Oral Oral Oral Oral  Resp: '17 18 16 16  ' Height:      Weight:      SpO2: 97% 100% 98% 99%    Constitutional: Appears calm,  alert and awake, oriented x3, not in any acute distress. Eyes: PERLA, EOMI, irises appear normal, anicteric sclera,  HENMT: external ears and nose appear normal, normal hearing or hard of hearing. Lips appears normal, oropharynx mucosa, tongue, posterior pharynx appear normal. Scalp with several chronic cysts  Neck: neck appears normal, no masses, normal ROM, no thyromegaly, no JVD  CVS: S1-S2 clear, no murmur rubs or gallops, no LE edema, normal pedal pulses  Respiratory: clear to auscultation bilaterally, no wheezing, rales or rhonchi. Respiratory effort normal. No accessory muscle use.  Abdomen: soft nontender,  nondistended, normal bowel sounds, no hepatosplenomegaly, no hernias  Musculoskeletal: no cyanosis, clubbing or edema noted bilaterally.  Joint/bones/muscle exam, strength, contractures or atrophy Neuro: Cranial nerves II-XII intact, strength, sensation, reflexes Psych: judgement and insight appear normal, stable mood and affect, mental status Skin: no rashes or lesions or ulcers, no induration or nodules   Data reviewed:  I have personally reviewed following labs and imaging studies Labs:  CBC:  Recent Labs Lab 05/14/16 1747 05/18/16 1455 05/19/16 0416  WBC 7.2 9.6 9.0  HGB 15.8 13.9 12.8*  HCT 48.8 42.4 41.0  MCV 91.2 90.0 90.9  PLT 312 290 897    Basic Metabolic Panel:  Recent Labs Lab 05/14/16 1747 05/18/16 1455 05/19/16 0416 05/20/16 0404  NA 138 139 145 142  K 3.2* 3.9 4.0 4.2  CL 105 105 111 113*  CO2 '27 27 24 23  ' GLUCOSE 111* 124* 101* 90  BUN 9 22* 21* 22*  CREATININE 1.23 2.56* 2.74* 2.61*  CALCIUM 9.7 8.8* 8.3* 8.4*  PHOS  --   --   --  3.4   GFR Estimated Creatinine Clearance: 49.7 mL/min (by C-G formula based on Cr of 2.61). Liver Function Tests:  Recent Labs Lab 05/18/16 1455 05/19/16 0416 05/20/16 0404  AST 25 18  --   ALT 23 19  --   ALKPHOS 55 49  --   BILITOT 1.0 0.7  --   PROT 7.8 6.4*  --   ALBUMIN 3.9 3.0* 2.9*    Recent Labs Lab 05/18/16 1455  LIPASE 23   No results for input(s): AMMONIA in the last 168 hours. Coagulation profile No results for input(s): INR, PROTIME in the last 168 hours.  Cardiac Enzymes:  Recent Labs Lab 05/19/16 1003  CKTOTAL 200   BNP: Invalid input(s): POCBNP CBG: No results for input(s): GLUCAP in the last 168 hours. D-Dimer No results for input(s): DDIMER in the last 72 hours. Hgb A1c No results for input(s): HGBA1C in the last 72 hours. Lipid Profile No results for input(s): CHOL, HDL, LDLCALC, TRIG, CHOLHDL, LDLDIRECT in the last 72 hours. Thyroid function studies No results for  input(s): TSH, T4TOTAL, T3FREE, THYROIDAB in the last 72 hours.  Invalid input(s): FREET3 Anemia work up No results for input(s): VITAMINB12, FOLATE, FERRITIN, TIBC, IRON, RETICCTPCT in the last 72 hours. Urinalysis    Component Value Date/Time   COLORURINE STRAW* 05/19/2016 2234   APPEARANCEUR CLEAR 05/19/2016 2234   LABSPEC 1.011 05/19/2016 2234   PHURINE 5.5 05/19/2016 2234   GLUCOSEU NEGATIVE 05/19/2016 2234   HGBUR TRACE* 05/19/2016 2234   Mars NEGATIVE 05/19/2016 2234   KETONESUR NEGATIVE 05/19/2016 2234   PROTEINUR NEGATIVE 05/19/2016 2234   NITRITE NEGATIVE 05/19/2016 2234   LEUKOCYTESUR NEGATIVE 05/19/2016 2234     Sepsis Labs Invalid input(s): PROCALCITONIN,  WBC,  LACTICIDVEN Microbiology No results found for this or any previous visit (from the past 240 hour(s)).  Inpatient Medications:   Scheduled Meds: . docusate sodium  100 mg Oral BID  . enoxaparin (LOVENOX) injection  30 mg Subcutaneous Q24H   Continuous Infusions: . 0.9 % NaCl with KCl 20 mEq / L 125 mL/hr at 05/18/16 2148     Radiological Exams on Admission: Ct Renal Stone Study  05/18/2016  CLINICAL DATA:  Evaluate for renal calculi.  Right flank pain. EXAM: CT ABDOMEN AND PELVIS WITHOUT CONTRAST TECHNIQUE: Multidetector CT imaging of the abdomen and pelvis was performed following the standard protocol without IV contrast. COMPARISON:  None FINDINGS: Lower chest: The lung bases are clear. No pleural or pericardial effusion noted. Hepatobiliary: No focal liver abnormality identified. The gallbladder appears normal. No biliary dilatation. Pancreas: No mass or inflammatory process identified on this un-enhanced exam. Spleen: Within normal limits in size. Adrenals/Urinary Tract: The adrenal glands are normal. No kidney stones or focal kidney abnormality identified on this unenhanced exam. No hydronephrosis or hydroureter. No bladder calculi identified. Stomach/Bowel: The stomach is normal. The small  bowel loops have a normal course and caliber. There is no bowel obstruction identified. The appendix is visualized in the right lower quadrant. There is focal thickening within the midportion of the appendix which measures up to 1 cm. Appendicoliths are also noted. No surrounding fat stranding or free fluid. Vascular/Lymphatic: There is no ascites or focal fluid collections within the abdomen or pelvis. Reproductive: No mass or other significant abnormality. Other: There is no ascites or focal fluid collections within the abdomen or pelvis. Musculoskeletal:  No aggressive lytic or sclerotic bone lesions. IMPRESSION: 1. There is no evidence for nephrolithiasis or hydronephrosis. 2. Focal increase diameter of the appendix which measures up to 10 mm. (Normal diameter of the appendix is considered 6 mm or less). Appendicoliths noted. No periappendiceal fat stranding, free fluid or fluid collections. Findings are equivocal for early appendicitis. Correlate for any clinical signs or symptoms of appendicitis. Electronically Signed   By: Kerby Moors M.D.   On: 05/18/2016 19:23    Impression/Recommendations Active Problems:   Appendicitis   Acute renal failure (ARF) (HCC)   Hyperglycemia  Acute Renal Failure, unclear etiology at this time,  Which may include ATN.  EGFR 36  (was >60 on 7/10) Current Cr 2.61(1.23 on 7/10) . Renal involved. Senologies pending . CT renal neg for hydro or nephrolitiasis. Nephrology involved.  Hold NSAIDs and Gatorade products  IV fluids .  Follow Cr in am.  Patient may need follow up as outpatient if Cr not normalized   Hyperglycemia, mild . Patient with strong family of DM  was 124 on 7/14 Current blood sugar level however, is normal at 90 at this time Hgb A1C to confirm  If DM, will obtain diabetes educator and arrange OP follow up at [facility] with Dr. Janne Napoleon Heart healthy carb modified diet.  Thank you for this consultation.  Our [facility] hospitalist team will  follow the patient with you.    [facility] E PA-C [facility] 05/20/2016, 11:04 AM

## 2016-05-20 NOTE — Progress Notes (Signed)
Subjective: Stable and alert.  Tolerating diet.  No pain or nausea.  Feels fine. Having bowel movements.  He says these are firm balls.  Recorded urine output 1100 mL in 24 hours Creatinine stable at 2.61.  BUN 22.  Sodium 142.  Potassium 4.2. IVs running at 125 mL per hour.  Appreciate consult from Dr. Marval Regal of nephrology service.  Objective: Vital signs in last 24 hours: Temp:  [98 F (36.7 C)-98.9 F (37.2 C)] 98.3 F (36.8 C) (07/16 0507) Pulse Rate:  [48-57] 48 (07/16 0507) Resp:  [16-18] 16 (07/16 0507) BP: (112-122)/(44-66) 112/44 mmHg (07/16 0507) SpO2:  [98 %-100 %] 99 % (07/16 0507) Last BM Date: 05/17/16  Intake/Output from previous day: 07/15 0701 - 07/16 0700 In: 1080 [P.O.:1080] Out: 1100 [Urine:1100] Intake/Output this shift: Total I/O In: -  Out: 450 [Urine:450]  General appearance: Alert.  Cooperative.  No distress. Resp: clear to auscultation bilaterally GI: soft, non-tender; bowel sounds normal; no masses,  no organomegaly  Lab Results:   Recent Labs  05/18/16 1455 05/19/16 0416  WBC 9.6 9.0  HGB 13.9 12.8*  HCT 42.4 41.0  PLT 290 259   BMET  Recent Labs  05/19/16 0416 05/20/16 0404  NA 145 142  K 4.0 4.2  CL 111 113*  CO2 24 23  GLUCOSE 101* 90  BUN 21* 22*  CREATININE 2.74* 2.61*  CALCIUM 8.3* 8.4*   PT/INR No results for input(s): LABPROT, INR in the last 72 hours. ABG No results for input(s): PHART, HCO3 in the last 72 hours.  Invalid input(s): PCO2, PO2  Studies/Results: Ct Renal Stone Study  05/18/2016  CLINICAL DATA:  Evaluate for renal calculi.  Right flank pain. EXAM: CT ABDOMEN AND PELVIS WITHOUT CONTRAST TECHNIQUE: Multidetector CT imaging of the abdomen and pelvis was performed following the standard protocol without IV contrast. COMPARISON:  None FINDINGS: Lower chest: The lung bases are clear. No pleural or pericardial effusion noted. Hepatobiliary: No focal liver abnormality identified. The gallbladder  appears normal. No biliary dilatation. Pancreas: No mass or inflammatory process identified on this un-enhanced exam. Spleen: Within normal limits in size. Adrenals/Urinary Tract: The adrenal glands are normal. No kidney stones or focal kidney abnormality identified on this unenhanced exam. No hydronephrosis or hydroureter. No bladder calculi identified. Stomach/Bowel: The stomach is normal. The small bowel loops have a normal course and caliber. There is no bowel obstruction identified. The appendix is visualized in the right lower quadrant. There is focal thickening within the midportion of the appendix which measures up to 1 cm. Appendicoliths are also noted. No surrounding fat stranding or free fluid. Vascular/Lymphatic: There is no ascites or focal fluid collections within the abdomen or pelvis. Reproductive: No mass or other significant abnormality. Other: There is no ascites or focal fluid collections within the abdomen or pelvis. Musculoskeletal:  No aggressive lytic or sclerotic bone lesions. IMPRESSION: 1. There is no evidence for nephrolithiasis or hydronephrosis. 2. Focal increase diameter of the appendix which measures up to 10 mm. (Normal diameter of the appendix is considered 6 mm or less). Appendicoliths noted. No periappendiceal fat stranding, free fluid or fluid collections. Findings are equivocal for early appendicitis. Correlate for any clinical signs or symptoms of appendicitis. Electronically Signed   By: Kerby Moors M.D.   On: 05/18/2016 19:23    Anti-infectives: Anti-infectives    Start     Dose/Rate Route Frequency Ordered Stop   05/18/16 2133  cefTRIAXone (ROCEPHIN) 2 g in dextrose 5 % 50 mL  IVPB  Status:  Discontinued     2 g 100 mL/hr over 30 Minutes Intravenous Every 24 hours 05/18/16 2134 05/19/16 0740   05/18/16 2133  metroNIDAZOLE (FLAGYL) IVPB 500 mg  Status:  Discontinued     500 mg 100 mL/hr over 60 Minutes Intravenous Every 8 hours 05/18/16 2134 05/19/16 0740       Assessment/Plan:   ARF.  Unclear etiology.  See differential diagnosis per nephrology.  Nonoliguric.  Creatinine stable. Defer management to nephrology I have consulted [facility] to possibly transfer to their service, unless nephrology feels that he may be discharged.  Abdominal pain resolved.  Clinically and radiographically he does not have appendicitis, and we will not treat him as such. He may be discharged home from a surgical standpoint. There is no indication for surgical intervention.   LOS: 1 day    Dennis Harvey 05/20/2016

## 2016-05-21 ENCOUNTER — Encounter (HOSPITAL_COMMUNITY): Payer: Self-pay | Admitting: General Practice

## 2016-05-21 DIAGNOSIS — N179 Acute kidney failure, unspecified: Secondary | ICD-10-CM

## 2016-05-21 DIAGNOSIS — K3589 Other acute appendicitis: Secondary | ICD-10-CM

## 2016-05-21 LAB — CBC
HCT: 45.2 % (ref 39.0–52.0)
HEMOGLOBIN: 14.9 g/dL (ref 13.0–17.0)
MCH: 29.4 pg (ref 26.0–34.0)
MCHC: 33 g/dL (ref 30.0–36.0)
MCV: 89.2 fL (ref 78.0–100.0)
Platelets: 238 10*3/uL (ref 150–400)
RBC: 5.07 MIL/uL (ref 4.22–5.81)
RDW: 12.9 % (ref 11.5–15.5)
WBC: 7.5 10*3/uL (ref 4.0–10.5)

## 2016-05-21 LAB — ANTI-DNA ANTIBODY, DOUBLE-STRANDED: ds DNA Ab: 1 IU/mL (ref 0–9)

## 2016-05-21 LAB — HEMOGLOBIN A1C
HEMOGLOBIN A1C: 5.1 % (ref 4.8–5.6)
Mean Plasma Glucose: 100 mg/dL

## 2016-05-21 LAB — RENAL FUNCTION PANEL
ANION GAP: 4 — AB (ref 5–15)
Albumin: 3.4 g/dL — ABNORMAL LOW (ref 3.5–5.0)
BUN: 24 mg/dL — ABNORMAL HIGH (ref 6–20)
CALCIUM: 9 mg/dL (ref 8.9–10.3)
CHLORIDE: 114 mmol/L — AB (ref 101–111)
CO2: 25 mmol/L (ref 22–32)
Creatinine, Ser: 2.49 mg/dL — ABNORMAL HIGH (ref 0.61–1.24)
GFR calc Af Amer: 38 mL/min — ABNORMAL LOW (ref >60)
GFR calc non Af Amer: 33 mL/min — ABNORMAL LOW (ref >60)
GLUCOSE: 89 mg/dL (ref 65–99)
Phosphorus: 3.6 mg/dL (ref 2.5–4.6)
Potassium: 4.1 mmol/L (ref 3.5–5.1)
SODIUM: 143 mmol/L (ref 135–145)

## 2016-05-21 LAB — ANTINUCLEAR ANTIBODIES, IFA: ANTINUCLEAR ANTIBODIES, IFA: NEGATIVE

## 2016-05-21 LAB — GLOMERULAR BASEMENT MEMBRANE ANTIBODIES: GBM AB: 6 U (ref 0–20)

## 2016-05-21 MED ORDER — ENOXAPARIN SODIUM 40 MG/0.4ML ~~LOC~~ SOLN
40.0000 mg | SUBCUTANEOUS | Status: DC
  Filled 2016-05-21: qty 0.4

## 2016-05-21 NOTE — Progress Notes (Signed)
Ok to leave IV out per Dr. Darrick Meigs

## 2016-05-21 NOTE — Progress Notes (Signed)
Patient ID: Dennis Harvey, male   DOB: [birth year], 30 y.o.   MRN: [id number] S:feels better O:BP 115/55 mmHg  Pulse 53  Temp(Src) 98.6 F (37 C) (Oral)  Resp 16  Ht 5\' 7"  (1.702 m)  Wt 113.036 kg (249 lb 3.2 oz)  BMI 39.02 kg/m2  SpO2 98%  Intake/Output Summary (Last 24 hours) at 05/21/16 0908 Last data filed at 05/21/16 0725  Gross per 24 hour  Intake   2878 ml  Output   2700 ml  Net    178 ml   Intake/Output: I/O last 3 completed shifts: In: 3238 [P.O.:1800; I.V.:1438] Out: 3075 [Urine:3075]  Intake/Output this shift:  Total I/O In: -  Out: 800 [Urine:800] Weight change:  Gen:WD WN AAM in NAD CVS:no rub Resp:cta [id number] Ext:no edema   Recent Labs Lab 05/14/16 1747 05/18/16 1455 05/19/16 0416 05/20/16 0404 05/21/16 0735  NA 138 139 145 142 143  K 3.2* 3.9 4.0 4.2 4.1  CL 105 105 111 113* 114*  CO2 27 27 24 23 25   GLUCOSE 111* 124* 101* 90 89  BUN 9 22* 21* 22* 24*  CREATININE 1.23 2.56* 2.74* 2.61* 2.49*  ALBUMIN  --  3.9 3.0* 2.9* 3.4*  CALCIUM 9.7 8.8* 8.3* 8.4* 9.0  PHOS  --   --   --  3.4 3.6  AST  --  25 18  --   --   ALT  --  23 19  --   --    Liver Function Tests:  Recent Labs Lab 05/18/16 1455 05/19/16 0416 05/20/16 0404 05/21/16 0735  AST 25 18  --   --   ALT 23 19  --   --   ALKPHOS 55 49  --   --   BILITOT 1.0 0.7  --   --   PROT 7.8 6.4*  --   --   ALBUMIN 3.9 3.0* 2.9* 3.4*    Recent Labs Lab 05/18/16 1455  LIPASE 23   No results for input(s): AMMONIA in the last 168 hours. CBC:  Recent Labs Lab 05/14/16 1747 05/18/16 1455 05/19/16 0416 05/21/16 0414  WBC 7.2 9.6 9.0 7.5  HGB 15.8 13.9 12.8* 14.9  HCT 48.8 42.4 41.0 45.2  MCV 91.2 90.0 90.9 89.2  PLT 312 290 259 238   Cardiac Enzymes:  Recent Labs Lab 05/19/16 1003  CKTOTAL 200   CBG: No results for input(s): GLUCAP in the last 168 hours.  Iron Studies: No results for input(s): IRON, TIBC, TRANSFERRIN, FERRITIN in the last 72  hours. Studies/Results: No results found. . docusate sodium  100 mg Oral BID  . enoxaparin (LOVENOX) injection  30 mg Subcutaneous Q24H    BMET    Component Value Date/Time   NA 143 05/21/2016 0735   K 4.1 05/21/2016 0735   CL 114* 05/21/2016 0735   CO2 25 05/21/2016 0735   GLUCOSE 89 05/21/2016 0735   BUN 24* 05/21/2016 0735   CREATININE 2.49* 05/21/2016 0735   CALCIUM 9.0 05/21/2016 0735   GFRNONAA 33* 05/21/2016 0735   GFRAA 38* 05/21/2016 0735   CBC    Component Value Date/Time   WBC 7.5 05/21/2016 0414   RBC 5.07 05/21/2016 0414   HGB 14.9 05/21/2016 0414   HCT 45.2 05/21/2016 0414   PLT 238 05/21/2016 0414   MCV 89.2 05/21/2016 0414   MCH 29.4 05/21/2016 0414   MCHC 33.0 05/21/2016 0414   RDW 12.9 05/21/2016 0414     Assessment/Plan: 1. ARF- unclear etiology.  Possibly ischemic ATN, however acute GN is Harvey concern. Serologies pending. CT scan without evidence of obstruction. No uremic symptoms. No nephrotoxic agents identified. Workup underway. He may require renal biopsy if his renal function continues to worsen or does not improve. 1. Discontinue IVF's and see if renal function continues to improve. 2. Hold off on renal biopsy for now unless renal function worsens tomorrow 3. Serologies pending (negative/normal complements, dsDNA, anti-GBM, ANCA, ASO).  Only minimal hgb on UA.   2. Abdominal pain- CT revealed increased diameter of appendix concerning for appendicitis. Surgery following. 3. Hyperglycemia- may be new diagnosis of diabetes mellitus type 2  Dennis Harvey Dennis Harvey

## 2016-05-21 NOTE — Progress Notes (Signed)
[facility]  PROGRESS NOTE  Dennis Harvey [id number] DOB: Oct 21, 1986 DOA: 05/18/2016 PCP: Webb Silversmith, NP    Brief HPI:  30 yo male who presents with a one-day history of mid-abdominal pain that has now migrated more to the RLQ and R flank. 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 He presented to the ED for evaluation and was noted to have elevated creatinine. A non-contrasted CT scan was performed to look for kidney stones. This showed possible early appendicitis.  Active Problems:   Appendicitis   Acute renal failure (ARF) (HCC)   Hyperglycemia   Assessment/Plan: 1. Acute kidney injury-? Cause,  likely from dehydration, ischemic ATN. Nephrology following. Plan for renal biopsy if kidney functions do not improve. Serologies negative so far. 2. Abdominal pain- resolved, CT scan showed possibility of appendicitis. General surgery consulted and at this time no surgical intervention indicated. Patient ruled out of appendicitis 3. ? Hyperglycemia- patient's hemoglobin A1c is 5.1. Blood sugar has been well controlled. Will monitor.   DVT prophylaxis: Lovenox Code Status: Full code Family Communication: No family at bedside Disposition Plan: Home in next 1-2 days   Consultants:  Surgery  Nephrology   Procedures:  None  Antibiotics:  None   Subjective: Pateint seen and examined, denies any symptoms this morning.  Objective: Filed Vitals:   05/20/16 1300 05/20/16 2040 05/21/16 0556 05/21/16 1300  BP: 129/61 134/51 115/55 120/60  Pulse: 55 61 53 63  Temp: 98.3 F (36.8 C) 98.6 F (37 C) 98.6 F (37 C) 98.1 F (36.7 C)  TempSrc: Oral Oral Oral Oral  Resp: 18 16 16 18   Height:      Weight:      SpO2: 100% 99% 98% 100%    Intake/Output Summary (Last 24 hours) at 05/21/16 1556 Last data filed at 05/21/16 1301  Gross per 24 hour  Intake   2518 ml  Output   3550 ml  Net  -1032 ml   Filed Weights   05/18/16 2137  Weight: 113.036 kg  (249 lb 3.2 oz)    Examination:  General exam: Appears calm and comfortable  Respiratory system: Clear to auscultation. Respiratory effort normal. Cardiovascular system: S1 & S2 heard, RRR. No JVD, murmurs, rubs, gallops or clicks. No pedal edema. Gastrointestinal system: Abdomen is nondistended, soft and nontender. No organomegaly or masses felt. Normal bowel sounds heard. Central nervous system: Alert and oriented. No focal neurological deficits. Extremities: Symmetric 5 x 5 power. Skin: No rashes, lesions or ulcers Psychiatry: Judgement and insight appear normal. Mood & affect appropriate.    Data Reviewed: I have personally reviewed following labs and imaging studies Basic Metabolic Panel:  Recent Labs Lab 05/14/16 1747 05/18/16 1455 05/19/16 0416 05/20/16 0404 05/21/16 0735  NA 138 139 145 142 143  K 3.2* 3.9 4.0 4.2 4.1  CL 105 105 111 113* 114*  CO2 27 27 24 23 25   GLUCOSE 111* 124* 101* 90 89  BUN 9 22* 21* 22* 24*  CREATININE 1.23 2.56* 2.74* 2.61* 2.49*  CALCIUM 9.7 8.8* 8.3* 8.4* 9.0  PHOS  --   --   --  3.4 3.6   Liver Function Tests:  Recent Labs Lab 05/18/16 1455 05/19/16 0416 05/20/16 0404 05/21/16 0735  AST 25 18  --   --   ALT 23 19  --   --   ALKPHOS 55 49  --   --   BILITOT 1.0 0.7  --   --   PROT 7.8  6.4*  --   --   ALBUMIN 3.9 3.0* 2.9* 3.4*    Recent Labs Lab 05/18/16 1455  LIPASE 23   No results for input(s): AMMONIA in the last 168 hours. CBC:  Recent Labs Lab 05/14/16 1747 05/18/16 1455 05/19/16 0416 05/21/16 0414  WBC 7.2 9.6 9.0 7.5  HGB 15.8 13.9 12.8* 14.9  HCT 48.8 42.4 41.0 45.2  MCV 91.2 90.0 90.9 89.2  PLT 312 290 259 238   Cardiac Enzymes:  Recent Labs Lab 05/19/16 1003  CKTOTAL 200     Studies: No results found.  Scheduled Meds: . docusate sodium  100 mg Oral BID  . [START ON 05/22/2016] enoxaparin (LOVENOX) injection  40 mg Subcutaneous Q24H   Continuous Infusions:      Time spent: 25  min    [facility] Pager [phone]. If 7PM-7AM, please contact night-coverage at [email or web address], Office  [phone]  password [email or web address] 05/21/2016, 3:56 PM  LOS: 2 days

## 2016-05-22 LAB — RENAL FUNCTION PANEL
ANION GAP: 9 (ref 5–15)
Albumin: 3.8 g/dL (ref 3.5–5.0)
BUN: 26 mg/dL — ABNORMAL HIGH (ref 6–20)
CHLORIDE: 109 mmol/L (ref 101–111)
CO2: 25 mmol/L (ref 22–32)
Calcium: 9.4 mg/dL (ref 8.9–10.3)
Creatinine, Ser: 2.27 mg/dL — ABNORMAL HIGH (ref 0.61–1.24)
GFR, EST AFRICAN AMERICAN: 43 mL/min — AB (ref >60)
GFR, EST NON AFRICAN AMERICAN: 37 mL/min — AB (ref >60)
Glucose, Bld: 94 mg/dL (ref 65–99)
POTASSIUM: 4 mmol/L (ref 3.5–5.1)
Phosphorus: 4.7 mg/dL — ABNORMAL HIGH (ref 2.5–4.6)
Sodium: 143 mmol/L (ref 135–145)

## 2016-05-22 NOTE — Progress Notes (Signed)
Patient ID: Dennis Harvey, male   DOB: 12/08/1985, 30 y.o.   MRN: [id number] S:feels well O:BP 133/83 mmHg  Pulse 74  Temp(Src) 98.8 F (37.1 C) (Oral)  Resp 17  Ht 5\' 7"  (1.702 m)  Wt 113.036 kg (249 lb 3.2 oz)  BMI 39.02 kg/m2  SpO2 98%  Intake/Output Summary (Last 24 hours) at 05/22/16 0837 Last data filed at 05/22/16 0740  Gross per 24 hour  Intake   1560 ml  Output   2500 ml  Net   -940 ml   Intake/Output: I/O last 3 completed shifts: In: 3478 [P.O.:2040; I.V.:1438] Out: 73 [Urine:4750]  Intake/Output this shift:  Total I/O In: -  Out: 650 [Urine:650] Weight change:  Gen:wd wn aam in nad CVS:no rub Resp:cta [id number] Ext:no edema    Recent Labs Lab 05/18/16 1455 05/19/16 0416 05/20/16 0404 05/21/16 0735 05/22/16 0401  NA 139 145 142 143 143  K 3.9 4.0 4.2 4.1 4.0  CL 105 111 113* 114* 109  CO2 27 24 23 25 25   GLUCOSE 124* 101* 90 89 94  BUN 22* 21* 22* 24* 26*  CREATININE 2.56* 2.74* 2.61* 2.49* 2.27*  ALBUMIN 3.9 3.0* 2.9* 3.4* 3.8  CALCIUM 8.8* 8.3* 8.4* 9.0 9.4  PHOS  --   --  3.4 3.6 4.7*  AST 25 18  --   --   --   ALT 23 19  --   --   --    Liver Function Tests:  Recent Labs Lab 05/18/16 1455 05/19/16 0416 05/20/16 0404 05/21/16 0735 05/22/16 0401  AST 25 18  --   --   --   ALT 23 19  --   --   --   ALKPHOS 55 49  --   --   --   BILITOT 1.0 0.7  --   --   --   PROT 7.8 6.4*  --   --   --   ALBUMIN 3.9 3.0* 2.9* 3.4* 3.8    Recent Labs Lab 05/18/16 1455  LIPASE 23   No results for input(s): AMMONIA in the last 168 hours. CBC:  Recent Labs Lab 05/18/16 1455 05/19/16 0416 05/21/16 0414  WBC 9.6 9.0 7.5  HGB 13.9 12.8* 14.9  HCT 42.4 41.0 45.2  MCV 90.0 90.9 89.2  PLT 290 259 238   Cardiac Enzymes:  Recent Labs Lab 05/19/16 1003  CKTOTAL 200   CBG: No results for input(s): GLUCAP in the last 168 hours.  Iron Studies: No results for input(s): IRON, TIBC, TRANSFERRIN, FERRITIN in the last 72  hours. Studies/Results: No results found. . docusate sodium  100 mg Oral BID  . enoxaparin (LOVENOX) injection  40 mg Subcutaneous Q24H    BMET    Component Value Date/Time   NA 143 05/22/2016 0401   K 4.0 05/22/2016 0401   CL 109 05/22/2016 0401   CO2 25 05/22/2016 0401   GLUCOSE 94 05/22/2016 0401   BUN 26* 05/22/2016 0401   CREATININE 2.27* 05/22/2016 0401   CALCIUM 9.4 05/22/2016 0401   GFRNONAA 37* 05/22/2016 0401   GFRAA 43* 05/22/2016 0401   CBC    Component Value Date/Time   WBC 7.5 05/21/2016 0414   RBC 5.07 05/21/2016 0414   HGB 14.9 05/21/2016 0414   HCT 45.2 05/21/2016 0414   PLT 238 05/21/2016 0414   MCV 89.2 05/21/2016 0414   MCH 29.4 05/21/2016 0414   MCHC 33.0 05/21/2016 0414   RDW 12.9 05/21/2016 0414  Assessment/Plan: 1. ARF- unclear etiology. Possibly ischemic ATN, however acute GN is a concern. Serologies pending. CT scan without evidence of obstruction. No uremic symptoms. No nephrotoxic agents identified. Workup underway. He may require renal biopsy if his renal function continues to worsen or does not improve. 1. Scr continued to improve off of IVF's  2. Hold off on renal biopsy as he has continued to improve and serologies are negative.   3. Most consistent with ischemic ATN 4. Serologies pending (negative/normal complements, dsDNA, anti-GBM, ANCA, ASO). Only minimal hgb on UA.  5. Will need to follow up in our office in 3-4 weeks and his PCP after discharge to follow renal function.  If his Scr rises again, we can arrange for outpatient renal biopsy. 6. Will need labs drawn next week at PCP's office (or hospitalist f/u clinic) 2. Abdominal pain- CT revealed increased diameter of appendix concerning for appendicitis. Surgery following. 3. Hyperglycemia- may be new diagnosis of diabetes mellitus type 2 4. Disposition- stable for discharge from renal standpoint with f/u as above.  [place]

## 2016-05-22 NOTE — Progress Notes (Signed)
Discharge paperwork given to patient. Patient ready to discharge.

## 2016-05-22 NOTE — Progress Notes (Signed)
Physician Discharge Summary  Dennis Harvey [id number] DOB: July 15, 1986 DOA: 05/18/2016  PCP: No PCP Per Patient  Admit date: 05/18/2016 Discharge date: 05/22/2016  Time spent: *25 minutes  Recommendations for Outpatient Follow-up:  1. Follow PCP , check BMP in 1 week 2. Follow-up nephrology 3 weeks   Discharge Diagnoses:  Active Problems:   Appendicitis   Acute renal failure (ARF) ([place])   Hyperglycemia   Discharge Condition: Stable  Diet recommendation: Regular  Filed Weights   05/18/16 2137  Weight: 113.036 kg (249 lb 3.2 oz)    History of present illness:  30 yo male who presents with a one-day history of mid-abdominal pain that has now migrated more to the RLQ and R flank. 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 He presented to the ED for evaluation and was noted to have elevated creatinine. A non-contrasted CT scan was performed to look for kidney stones. This showed possible early appendicitis.  Hospital Course:  1. Acute kidney injury-? Cause, likely from dehydration, ischemic ATN. Nephrology saw the patient and plan to follow-up as outpatient . Plan for renal biopsy if kidney functions do not improve. Serologies negative so far. He will need to check BMP in 1 week at primary care office 2. Abdominal pain- resolved, CT scan showed possibility of appendicitis. General surgery consulted and at this time no surgical intervention indicated. Patient ruled out of appendicitis 3. ? Hyperglycemia- patient's hemoglobin A1c is 5.1. Blood sugar has been well controlled.   Procedures:  None   Consultations:  Nephrology  Discharge Exam: Filed Vitals:   05/22/16 0514 05/22/16 1400  BP: 133/83 123/62  Pulse: 74 59  Temp: 98.8 F (37.1 C) 98.2 F (36.8 C)  Resp: 17 16    General: Appear in no acute distress Cardiovascular: S1-S2 regular Respiratory: clear to auscultation bilaterally  Discharge Instructions   Discharge Instructions    Diet -  low sodium heart healthy    Complete by:  As directed      Increase activity slowly    Complete by:  As directed           Current Discharge Medication List    CONTINUE these medications which have NOT CHANGED   Details  Multiple Vitamin (MULTIVITAMIN WITH MINERALS) TABS tablet Take 1 tablet by mouth daily. Men's One a Day      STOP taking these medications     Aspirin-Caffeine (BAYER BACK & BODY PO)      Potassium 99 MG TABS      cyclobenzaprine (FLEXERIL) 10 MG tablet      diclofenac (CATAFLAM) 50 MG tablet      HYDROcodone-acetaminophen (NORCO/VICODIN) 5-325 MG per tablet      naproxen (NAPROSYN) 500 MG tablet      potassium chloride SA (K-DUR,KLOR-CON) 20 MEQ tablet      traMADol (ULTRAM) 50 MG tablet        No Known Allergies Follow-up Information    Follow up with [facility] In 1 week.   Why:  Will need to check BMP in one week, to assess renal function   Contact information:   [address] [place] [phone]       The results of significant diagnostics from this hospitalization (including imaging, microbiology, ancillary and laboratory) are listed below for reference.    Significant Diagnostic Studies: Dg Chest 2 View  05/14/2016  CLINICAL DATA:  Chest pain and left arm numbness for 1  week. EXAM: CHEST  2 VIEW COMPARISON:  None. FINDINGS: The heart size and mediastinal contours are within normal limits. Both lungs are clear. The visualized skeletal structures are unremarkable. IMPRESSION: Normal examination. Electronically Signed   By: Claudie Revering M.D.   On: 05/14/2016 14:09   Ct Renal Stone Study  05/18/2016  CLINICAL DATA:  Evaluate for renal calculi.  Right flank pain. EXAM: CT ABDOMEN AND PELVIS WITHOUT CONTRAST TECHNIQUE: Multidetector CT imaging of the abdomen and pelvis was performed following the standard protocol without IV contrast. COMPARISON:  None FINDINGS: Lower chest: The  lung bases are clear. No pleural or pericardial effusion noted. Hepatobiliary: No focal liver abnormality identified. The gallbladder appears normal. No biliary dilatation. Pancreas: No mass or inflammatory process identified on this un-enhanced exam. Spleen: Within normal limits in size. Adrenals/Urinary Tract: The adrenal glands are normal. No kidney stones or focal kidney abnormality identified on this unenhanced exam. No hydronephrosis or hydroureter. No bladder calculi identified. Stomach/Bowel: The stomach is normal. The small bowel loops have a normal course and caliber. There is no bowel obstruction identified. The appendix is visualized in the right lower quadrant. There is focal thickening within the midportion of the appendix which measures up to 1 cm. Appendicoliths are also noted. No surrounding fat stranding or free fluid. Vascular/Lymphatic: There is no ascites or focal fluid collections within the abdomen or pelvis. Reproductive: No mass or other significant abnormality. Other: There is no ascites or focal fluid collections within the abdomen or pelvis. Musculoskeletal:  No aggressive lytic or sclerotic bone lesions. IMPRESSION: 1. There is no evidence for nephrolithiasis or hydronephrosis. 2. Focal increase diameter of the appendix which measures up to 10 mm. (Normal diameter of the appendix is considered 6 mm or less). Appendicoliths noted. No periappendiceal fat stranding, free fluid or fluid collections. Findings are equivocal for early appendicitis. Correlate for any clinical signs or symptoms of appendicitis. Electronically Signed   By: Kerby Moors M.D.   On: 05/18/2016 19:23    Microbiology: No results found for this or any previous visit (from the past 240 hour(s)).   Labs: Basic Metabolic Panel:  Recent Labs Lab 05/18/16 1455 05/19/16 0416 05/20/16 0404 05/21/16 0735 05/22/16 0401  NA 139 145 142 143 143  K 3.9 4.0 4.2 4.1 4.0  CL 105 111 113* 114* 109  CO2 27 24 23 25  25   GLUCOSE 124* 101* 90 89 94  BUN 22* 21* 22* 24* 26*  CREATININE 2.56* 2.74* 2.61* 2.49* 2.27*  CALCIUM 8.8* 8.3* 8.4* 9.0 9.4  PHOS  --   --  3.4 3.6 4.7*   Liver Function Tests:  Recent Labs Lab 05/18/16 1455 05/19/16 0416 05/20/16 0404 05/21/16 0735 05/22/16 0401  AST 25 18  --   --   --   ALT 23 19  --   --   --   ALKPHOS 55 49  --   --   --   BILITOT 1.0 0.7  --   --   --   PROT 7.8 6.4*  --   --   --   ALBUMIN 3.9 3.0* 2.9* 3.4* 3.8    Recent Labs Lab 05/18/16 1455  LIPASE 23   No results for input(s): AMMONIA in the last 168 hours. CBC:  Recent Labs Lab 05/18/16 1455 05/19/16 0416 05/21/16 0414  WBC 9.6 9.0 7.5  HGB 13.9 12.8* 14.9  HCT 42.4 41.0 45.2  MCV 90.0 90.9 89.2  PLT 290 259 238  Cardiac Enzymes:  Recent Labs Lab 05/19/16 1003  CKTOTAL 200   BNP:     Signed:  Oswald Hillock MD.  [facility] 05/22/2016, 2:30 PM

## 2016-06-01 ENCOUNTER — Ambulatory Visit: Payer: Medicaid Other | Attending: Internal Medicine | Admitting: Physician Assistant

## 2016-06-01 VITALS — BP 112/75 | HR 63 | Temp 97.8°F | Resp 16 | Wt 234.4 lb

## 2016-06-01 DIAGNOSIS — N179 Acute kidney failure, unspecified: Secondary | ICD-10-CM

## 2016-06-01 LAB — BASIC METABOLIC PANEL
BUN: 17 mg/dL (ref 7–25)
CHLORIDE: 107 mmol/L (ref 98–110)
CO2: 23 mmol/L (ref 20–31)
Calcium: 9.2 mg/dL (ref 8.6–10.3)
Creat: 1.39 mg/dL — ABNORMAL HIGH (ref 0.60–1.35)
GLUCOSE: 90 mg/dL (ref 65–99)
POTASSIUM: 3.8 mmol/L (ref 3.5–5.3)
Sodium: 140 mmol/L (ref 135–146)

## 2016-06-01 NOTE — Progress Notes (Signed)
Patient ID: Dennis Harvey, male   DOB: 11/25/1985, 30 y.o.   MRN: [id number]   Dennis Harvey, is a 30 y.o. male  [id number]  [id number]  DOB - 1986-02-10  Subjective:  Chief Complaint and HPI: Dennis Harvey is a 30 y.o. male here today to establish care and for a follow up visit after hospitalization 05/18/2016-05/22/2016 for abdominal pain, R/O appendicitis, acute kidney injury, and dehydration.  He did not have appendicitis or require surgery.  05/18/2016=BUN=22, Cr=2.56, GFR=37 05/22/2016=BUN=26, Cr=2.27, GFR=43 Elevated glucose with an A1C of 5.1 He denies any abdominal pain now.  No urinary s/sx.  No back or flank pain. No edema.   ED and Hospital notes/labs/imaging reviewed.    ROS:   Constitutional:  No f/c, No night sweats, No unexplained weight loss. EENT:  No vision changes, No blurry vision, No hearing changes. No mouth, throat, or ear problems.  Respiratory: No cough, No SOB Cardiac: No CP, no palpitations GI:  No abd pain, No N/V/D. GU: No Urinary s/sx Musculoskeletal: No joint pain Neuro: No headache, no dizziness, no motor weakness.  Skin: No rash Endocrine:  No polydipsia. No polyuria.  Psych: Denies SI/HI  No problems updated.  ALLERGIES: No Known Allergies  PAST MEDICAL HISTORY: Past Medical History:  Diagnosis Date  . ARF (acute renal failure) ([place]) 05/18/2016  . Environmental allergies   . Type II diabetes mellitus ([place]) dxd 05/20/2016    MEDICATIONS AT HOME: Prior to Admission medications   Medication Sig Start Date End Date Taking? Authorizing Provider  Multiple Vitamin (MULTIVITAMIN WITH MINERALS) TABS tablet Take 1 tablet by mouth daily. Men's One a Day   Yes Historical Provider, MD     Objective:  EXAM:   Vitals:   06/01/16 1123  BP: 112/75  Pulse: 63  Resp: 16  Temp: 97.8 F (36.6 C)  TempSrc: Oral  SpO2: 97%  Weight: 234 lb 6.4 oz (106.3 kg)    General appearance : A&OX3. NAD. Non-toxic-appearing HEENT: Atraumatic  and Normocephalic.  PERRLA. EOM intact.  TM clear B. Mouth-MMM, post pharynx WNL w/o erythema, No PND. Neck: supple, no JVD. No cervical lymphadenopathy. No thyromegaly Chest/Lungs:  Breathing-non-labored, Good air entry bilaterally, breath sounds normal without rales, rhonchi, or wheezing  CVS: S1 S2 regular, no murmurs, gallops, rubs  Abdomen: Bowel sounds present, Non tender and not distended with no gaurding, rigidity or rebound. Extremities: Bilateral Lower Ext shows no edema, both legs are warm to touch with = pulse throughout Neurology:  CN II-XII grossly intact, Non focal.   Psych:  TP linear. J/I WNL. Normal speech. Appropriate eye contact and affect.  Skin:  No Rash  Data Review Lab Results  Component Value Date   HGBA1C 5.1 05/20/2016     Assessment & Plan   1. Acute renal failure, unspecified acute renal failure type ([place]) Hopefully this was a self-limiting incident related to abdominal pain and dehydration.  Recheck BMP today - Basic metabolic panel  Patient have been counseled extensively about nutrition and exercise  Return in about 2 weeks (around 06/15/2016) for BMP f/up with me.  The patient was given clear instructions to go to ER or return to medical center if symptoms don't improve, worsen or new problems develop. The patient verbalized understanding. The patient was told to call to get lab results if they haven't heard anything in the next week.     [name], PA-C [facility] [place], [place]   06/01/2016, 11:35 AM

## 2016-06-01 NOTE — Progress Notes (Signed)
Pt is in the office today for a follow up from the ed Pt states he is feeling better Pt states he has a pain here and there but nothing bad

## 2016-06-06 ENCOUNTER — Telehealth: Payer: Self-pay

## 2016-06-06 NOTE — Discharge Summary (Signed)
Expand All Collapse All   Physician Discharge Summary  Dennis Harvey [id number] DOB: 10-03-1986 DOA: 05/18/2016  PCP: No PCP Per Patient  Admit date: 05/18/2016 Discharge date: 05/22/2016  Time spent: *25 minutes  Recommendations for Outpatient Follow-up:  1. Follow PCP , check BMP in 1 week 2. Follow-up nephrology 3 weeks   Discharge Diagnoses:  Active Problems:   Appendicitis   Acute renal failure (ARF) ([place])   Hyperglycemia   Discharge Condition: Stable  Diet recommendation: Regular     Filed Weights   05/18/16 2137  Weight: 113.036 kg (249 lb 3.2 oz)    History of present illness:  30 yo male who presents with a one-day history of mid-abdominal pain that has now migrated more to the RLQ and R flank. 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 He presented to the ED for evaluation and was noted to have elevated creatinine. A non-contrasted CT scan was performed to look for kidney stones. This showed possible early appendicitis.  Hospital Course:  1. Acute kidney injury-? Cause, likely from dehydration, ischemic ATN. Nephrology saw the patient and plan to follow-up as outpatient . Plan for renal biopsy if kidney functions do not improve. Serologies negative so far. He will need to check BMP in 1 week at primary care office 2. Abdominal pain- resolved, CT scan showed possibility of appendicitis. General surgery consulted and at this time no surgical intervention indicated. Patient ruled out of appendicitis 3. ? Hyperglycemia- patient's hemoglobin A1c is 5.1. Blood sugar has been well controlled.   Procedures:  None   Consultations:  Nephrology  Discharge Exam:     Filed Vitals:   05/22/16 0514 05/22/16 1400  BP: 133/83 123/62  Pulse: 74 59  Temp: 98.8 F (37.1 C) 98.2 F (36.8 C)  Resp: 17 16    General: Appear in no acute distress Cardiovascular: S1-S2 regular Respiratory: clear to auscultation  bilaterally  Discharge Instructions       Discharge Instructions    Diet - low sodium heart healthy    Complete by:  As directed      Increase activity slowly    Complete by:  As directed               Current Discharge Medication List    CONTINUE these medications which have NOT CHANGED   Details  Multiple Vitamin (MULTIVITAMIN WITH MINERALS) TABS tablet Take 1 tablet by mouth daily. Men's One a Day      STOP taking these medications     Aspirin-Caffeine (BAYER BACK & BODY PO)      Potassium 99 MG TABS      cyclobenzaprine (FLEXERIL) 10 MG tablet      diclofenac (CATAFLAM) 50 MG tablet      HYDROcodone-acetaminophen (NORCO/VICODIN) 5-325 MG per tablet      naproxen (NAPROSYN) 500 MG tablet      potassium chloride SA (K-DUR,KLOR-CON) 20 MEQ tablet      traMADol (ULTRAM) 50 MG tablet        No Known Allergies    Follow-up Information    Follow up with [facility] In 1 week.   Why:  Will need to check BMP in one week, to assess renal function   Contact information:   [address]  [place] [phone]        The results of significant diagnostics from this hospitalization (including imaging, microbiology, ancillary and laboratory) are listed below for  reference.    Significant Diagnostic Studies:  Imaging Results  Dg Chest 2 View  05/14/2016  CLINICAL DATA:  Chest pain and left arm numbness for 1 week. EXAM: CHEST  2 VIEW COMPARISON:  None. FINDINGS: The heart size and mediastinal contours are within normal limits. Both lungs are clear. The visualized skeletal structures are unremarkable. IMPRESSION: Normal examination. Electronically Signed   By: Claudie Revering M.D.   On: 05/14/2016 14:09   Ct Renal Stone Study  05/18/2016  CLINICAL DATA:  Evaluate for renal calculi.  Right flank pain. EXAM: CT ABDOMEN AND PELVIS WITHOUT CONTRAST  TECHNIQUE: Multidetector CT imaging of the abdomen and pelvis was performed following the standard protocol without IV contrast. COMPARISON:  None FINDINGS: Lower chest: The lung bases are clear. No pleural or pericardial effusion noted. Hepatobiliary: No focal liver abnormality identified. The gallbladder appears normal. No biliary dilatation. Pancreas: No mass or inflammatory process identified on this un-enhanced exam. Spleen: Within normal limits in size. Adrenals/Urinary Tract: The adrenal glands are normal. No kidney stones or focal kidney abnormality identified on this unenhanced exam. No hydronephrosis or hydroureter. No bladder calculi identified. Stomach/Bowel: The stomach is normal. The small bowel loops have a normal course and caliber. There is no bowel obstruction identified. The appendix is visualized in the right lower quadrant. There is focal thickening within the midportion of the appendix which measures up to 1 cm. Appendicoliths are also noted. No surrounding fat stranding or free fluid. Vascular/Lymphatic: There is no ascites or focal fluid collections within the abdomen or pelvis. Reproductive: No mass or other significant abnormality. Other: There is no ascites or focal fluid collections within the abdomen or pelvis. Musculoskeletal:  No aggressive lytic or sclerotic bone lesions. IMPRESSION: 1. There is no evidence for nephrolithiasis or hydronephrosis. 2. Focal increase diameter of the appendix which measures up to 10 mm. (Normal diameter of the appendix is considered 6 mm or less). Appendicoliths noted. No periappendiceal fat stranding, free fluid or fluid collections. Findings are equivocal for early appendicitis. Correlate for any clinical signs or symptoms of appendicitis. Electronically Signed   By: Kerby Moors M.D.   On: 05/18/2016 19:23     Microbiology: No results found for this or any previous visit (from the past 240 hour(s)).   Labs: Basic Metabolic Panel:  Last  Labs    Recent Labs Lab 05/18/16 1455 05/19/16 0416 05/20/16 0404 05/21/16 0735 05/22/16 0401  NA 139 145 142 143 143  K 3.9 4.0 4.2 4.1 4.0  CL 105 111 113* 114* 109  CO2 27 24 23 25 25   GLUCOSE 124* 101* 90 89 94  BUN 22* 21* 22* 24* 26*  CREATININE 2.56* 2.74* 2.61* 2.49* 2.27*  CALCIUM 8.8* 8.3* 8.4* 9.0 9.4  PHOS  --   --  3.4 3.6 4.7*     Liver Function Tests:  Last Labs    Recent Labs Lab 05/18/16 1455 05/19/16 0416 05/20/16 0404 05/21/16 0735 05/22/16 0401  AST 25 18  --   --   --   ALT 23 19  --   --   --   ALKPHOS 55 49  --   --   --   BILITOT 1.0 0.7  --   --   --   PROT 7.8 6.4*  --   --   --   ALBUMIN 3.9 3.0* 2.9* 3.4* 3.8      Last Labs    Recent Labs Lab 05/18/16 1455  LIPASE 23  Last Labs   No results for input(s): AMMONIA in the last 168 hours.   CBC:  Last Labs    Recent Labs Lab 05/18/16 1455 05/19/16 0416 05/21/16 0414  WBC 9.6 9.0 7.5  HGB 13.9 12.8* 14.9  HCT 42.4 41.0 45.2  MCV 90.0 90.9 89.2  PLT 290 259 238     Cardiac Enzymes:  Last Labs    Recent Labs Lab 05/19/16 1003  CKTOTAL 200     BNP:     Signed:  Oswald Hillock MD.  [facility] 05/22/2016, 2:30 PM

## 2016-06-06 NOTE — Telephone Encounter (Signed)
Contacted pt to go over lab results. Pt states he understands he will schedule a physical. Pt states he doesn't have any questions or concerns.

## 2016-11-22 ENCOUNTER — Emergency Department: Payer: Medicaid Other

## 2016-11-22 ENCOUNTER — Emergency Department
Admission: EM | Admit: 2016-11-22 | Discharge: 2016-11-22 | Disposition: A | Discharge status: Home / Self Care | Payer: Medicaid Other | Attending: Emergency Medicine | Admitting: Emergency Medicine

## 2016-11-22 ENCOUNTER — Encounter: Payer: Self-pay | Admitting: Emergency Medicine

## 2016-11-22 DIAGNOSIS — R079 Chest pain, unspecified: Secondary | ICD-10-CM | POA: Insufficient documentation

## 2016-11-22 DIAGNOSIS — Z87891 Personal history of nicotine dependence: Secondary | ICD-10-CM | POA: Insufficient documentation

## 2016-11-22 DIAGNOSIS — R61 Generalized hyperhidrosis: Secondary | ICD-10-CM | POA: Insufficient documentation

## 2016-11-22 DIAGNOSIS — R0602 Shortness of breath: Secondary | ICD-10-CM | POA: Insufficient documentation

## 2016-11-22 DIAGNOSIS — E119 Type 2 diabetes mellitus without complications: Secondary | ICD-10-CM | POA: Insufficient documentation

## 2016-11-22 LAB — TROPONIN I

## 2016-11-22 LAB — CBC
HCT: 41.5 % (ref 40.0–52.0)
HEMOGLOBIN: 14.1 g/dL (ref 13.0–18.0)
MCH: 30.8 pg (ref 26.0–34.0)
MCHC: 33.9 g/dL (ref 32.0–36.0)
MCV: 90.8 fL (ref 80.0–100.0)
PLATELETS: 289 10*3/uL (ref 150–440)
RBC: 4.57 MIL/uL (ref 4.40–5.90)
RDW: 13.1 % (ref 11.5–14.5)
WBC: 5.6 10*3/uL (ref 3.8–10.6)

## 2016-11-22 LAB — FIBRIN DERIVATIVES D-DIMER (ARMC ONLY): Fibrin derivatives D-dimer (ARMC): 147 (ref 0–499)

## 2016-11-22 LAB — BASIC METABOLIC PANEL
ANION GAP: 5 (ref 5–15)
BUN: 21 mg/dL — ABNORMAL HIGH (ref 6–20)
CALCIUM: 8.8 mg/dL — AB (ref 8.9–10.3)
CHLORIDE: 108 mmol/L (ref 101–111)
CO2: 27 mmol/L (ref 22–32)
CREATININE: 1.2 mg/dL (ref 0.61–1.24)
GFR calc non Af Amer: >60 mL/min (ref >60)
Glucose, Bld: 102 mg/dL — ABNORMAL HIGH (ref 65–99)
Potassium: 3.9 mmol/L (ref 3.5–5.1)
SODIUM: 140 mmol/L (ref 135–145)

## 2016-11-22 MED ORDER — ASPIRIN 81 MG PO CHEW
324.0000 mg | CHEWABLE_TABLET | Freq: Once | ORAL | Status: AC
  Administered 2016-11-22: 324 mg via ORAL
  Filled 2016-11-22: qty 4

## 2016-11-22 NOTE — ED Provider Notes (Signed)
[facility] Emergency Department Provider Note  ____________________________________________  Time seen: Approximately 5:02 AM  I have reviewed the triage vital signs and the nursing notes.   HISTORY  Chief Complaint Chest Pain (Pt. stated he woke with left chest pressure.)    HPI Dennis Harvey is a 31 y.o. male with a history of DM 2, presenting with chest pain. The patient reports that for the past 3 days he has had a constant substernal chest "pressure" with intermittent episodes of fleeting "fluttering" pain that lasts for several seconds and resolves spontaneously. These episodes are worse when laying down and returned to go to sleep. They're not related to food, and he has no associated burping. This morning, the patient had associated shortness of breath and diaphoresis so he called the paramedics. At this time, he feels some mild pressure but his symptoms have almost completely resolved.  FH: no FH of early CAD or sudden cardiac death  SH: denies tobacco or cocaine   Past Medical History:  Diagnosis Date  . ARF (acute renal failure) ([place]) 05/18/2016  . Environmental allergies   . Type II diabetes mellitus ([place]) dxd 05/20/2016    Patient Active Problem List   Diagnosis Date Noted  . Acute renal failure (ARF) ([place]) 05/20/2016  . Hyperglycemia 05/20/2016  . Appendicitis 05/18/2016    Past Surgical History:  Procedure Laterality Date  . NO PAST SURGERIES      Current Outpatient Rx  . Order #: [id number] Class: Historical Med    Allergies Patient has no known allergies.  Family History  Problem Relation Age of Onset  . Diabetes Maternal Grandmother   . Heart failure Maternal Grandmother   . Hyperlipidemia Maternal Grandmother   . Hypertension Maternal Grandmother   . Diabetes Father     Social History Social History  Substance Use Topics  . Smoking status: Former Smoker    Years: 2.00    Types: Cigars  . Smokeless tobacco: Never  Used     Comment: 05/21/2016 "stopped smoking cigarettes in ~ 2007"  . Alcohol use 0.6 oz/week    1 Shots of liquor per week    Review of Systems Constitutional: No fever/chills.No lightheadedness or syncope. Positive diaphoresis. Eyes: No visual changes. ENT: No sore throat. No congestion or rhinorrhea. Cardiovascular: Positive chest pain. Denies palpitations. Respiratory: Positive shortness of breath.  No cough. Gastrointestinal: No abdominal pain.  No nausea, no vomiting.  No diarrhea.  No constipation. Genitourinary: Negative for dysuria. Musculoskeletal: Negative for back pain. Skin: Negative for rash. Neurological: Negative for headaches. No focal numbness, tingling or weakness.   10-point ROS otherwise negative.  ____________________________________________   PHYSICAL EXAM:  VITAL SIGNS: ED Triage Vitals  Enc Vitals Group     BP 11/22/16 0454 136/76     Pulse Rate 11/22/16 0454 67     Resp 11/22/16 0454 18     Temp 11/22/16 0454 97.6 F (36.4 C)     Temp Source 11/22/16 0454 Oral     SpO2 11/22/16 0454 100 %     Weight 11/22/16 0455 234 lb (106.1 kg)     Height 11/22/16 0455 5\' 10"  (1.778 m)     Head Circumference --      Peak Flow --      Pain Score 11/22/16 0459 3     Pain Loc --      Pain Edu? --      Excl. in [place]? --     Constitutional: Alert and oriented. Well  appearing and in no acute distress. Answers questions appropriately. Eyes: Conjunctivae are normal.  EOMI. No scleral icterus. Head: Atraumatic. Nose: No congestion/rhinnorhea. Mouth/Throat: Mucous membranes are moist.  Neck: No stridor.  Supple.  No JVD. Cardiovascular: Normal rate, regular rhythm. No murmurs, rubs or gallops.  Respiratory: Normal respiratory effort.  No accessory muscle use or retractions. Lungs CTAB.  No wheezes, rales or ronchi. Gastrointestinal: Soft, nontender and nondistended.  No guarding or rebound.  No peritoneal signs. Musculoskeletal: No LE edema. No ttp in the calves  or palpable cords.  Negative Homan's sign. Neurologic:  A&Ox3.  Speech is clear.  Face and smile are symmetric.  EOMI.  Moves all extremities well. Skin:  Skin is warm, dry and intact. No rash noted. Psychiatric: Mood and affect are normal. Speech and behavior are normal.  Normal judgement.  ____________________________________________   LABS (all labs ordered are listed, but only abnormal results are displayed)  Labs Reviewed  BASIC METABOLIC PANEL - Abnormal; Notable for the following:       Result Value   Glucose, Bld 102 (*)    BUN 21 (*)    Calcium 8.8 (*)    All other components within normal limits  CBC  TROPONIN I  FIBRIN DERIVATIVES D-DIMER ([facility] ONLY)  TROPONIN I   ____________________________________________  EKG  ED ECG REPORT I, Eula Listen, the attending physician, personally viewed and interpreted this ECG.   Date: 11/22/2016  EKG Time: 502  Rate: 64  Rhythm: normal sinus rhythm  Axis: normal  Intervals:none  ST&T Change: Nonspecific T-wave inversion in V1. 1 mm ST elevation in V2 and 0.5 mm ST elevation in V3 consistent with early repolarization.  No STEMI.  ____________________________________________  RADIOLOGY  Dg Chest 2 View  Result Date: 11/22/2016 CLINICAL DATA:  31 y/o  M; nonradiating left chest pressure. EXAM: CHEST  2 VIEW COMPARISON:  05/14/2016 chest radiograph. FINDINGS: The heart size and mediastinal contours are within normal limits and stable. Both lungs are clear. The visualized skeletal structures are unremarkable. IMPRESSION: No acute pulmonary process identified. Electronically Signed   By: Kristine Garbe M.D.   On: 11/22/2016 05:22    ____________________________________________   PROCEDURES  Procedure(s) performed: None  Procedures  Critical Care performed: No ____________________________________________   INITIAL IMPRESSION / ASSESSMENT AND PLAN / ED COURSE  Pertinent labs & imaging results that  were available during my care of the patient were reviewed by me and considered in my medical decision making (see chart for details).  31 y.o. male presenting w/ chest pain.  The patient has reassuring vital signs and no acute findings on my cardiopulmonary or abdominal examination. He does have a diabetes but otherwise is very low risk for coronary artery disease. His EKG does not show ischemic changes and we'll get a troponin now and in 2 hours. I would consider an intermittent arrhythmia given that he has the fluttering sensation, and we'll keep him on a monitor here and continued to look for this. The patient is a truck driver, and has no hypoxia, tachycardia, low-grade fever, or evidence of DVT, but I'll get a d-dimer to evaluate for his risk of PE which is low. A GI cause for the patient's symptoms possible.  ----------------------------------------- 5:49 AM on 11/22/2016 -----------------------------------------   The patient's workup in the emergency department is reassuring. His EKG does not show ischemic changes, nor is there any evidence of Brugada syndrome, prolonged QTC, or hypertrophy. In addition, the patient's troponin is negative. I will repeat  another troponin. Patient's chest x-ray does not show any acute cardiopulmonary process and the remainder of his labs are reassuring. His d-dimer is still pending.  ____________________________________________  FINAL CLINICAL IMPRESSION(S) / ED DIAGNOSES  Final diagnoses:  Chest pain, unspecified type  Shortness of breath  Diaphoresis         NEW MEDICATIONS STARTED DURING THIS VISIT:  New Prescriptions   No medications on file      Eula Listen, MD 11/22/16 [phone]

## 2016-11-22 NOTE — Discharge Instructions (Signed)
Please return to the emergency department if he develops severe pain, shortness of breath, lightheadedness or fainting, or any other symptoms concerning to you.

## 2016-11-22 NOTE — ED Triage Notes (Signed)
Pt. States he woke with left chest pressure that does not radiate.  Pt. States chest "flutter" for the past couple days.

## 2017-03-03 ENCOUNTER — Other Ambulatory Visit: Payer: Self-pay

## 2017-03-03 ENCOUNTER — Encounter (HOSPITAL_COMMUNITY): Payer: Self-pay

## 2017-03-03 ENCOUNTER — Emergency Department (HOSPITAL_COMMUNITY): Payer: Medicaid Other

## 2017-03-03 ENCOUNTER — Emergency Department (HOSPITAL_COMMUNITY)
Admission: EM | Admit: 2017-03-03 | Discharge: 2017-03-04 | Disposition: A | Discharge status: Home / Self Care | Payer: Medicaid Other | Attending: Emergency Medicine | Admitting: Emergency Medicine

## 2017-03-03 DIAGNOSIS — R072 Precordial pain: Secondary | ICD-10-CM | POA: Insufficient documentation

## 2017-03-03 DIAGNOSIS — Z87891 Personal history of nicotine dependence: Secondary | ICD-10-CM | POA: Insufficient documentation

## 2017-03-03 DIAGNOSIS — Z7982 Long term (current) use of aspirin: Secondary | ICD-10-CM | POA: Insufficient documentation

## 2017-03-03 DIAGNOSIS — E119 Type 2 diabetes mellitus without complications: Secondary | ICD-10-CM | POA: Insufficient documentation

## 2017-03-03 LAB — CBC
HEMATOCRIT: 43 % (ref 39.0–52.0)
Hemoglobin: 14.3 g/dL (ref 13.0–17.0)
MCH: 29.9 pg (ref 26.0–34.0)
MCHC: 33.3 g/dL (ref 30.0–36.0)
MCV: 90 fL (ref 78.0–100.0)
PLATELETS: 307 10*3/uL (ref 150–400)
RBC: 4.78 MIL/uL (ref 4.22–5.81)
RDW: 12.9 % (ref 11.5–15.5)
WBC: 7.7 10*3/uL (ref 4.0–10.5)

## 2017-03-03 LAB — BASIC METABOLIC PANEL
Anion gap: 11 (ref 5–15)
BUN: 13 mg/dL (ref 6–20)
CALCIUM: 9.2 mg/dL (ref 8.9–10.3)
CO2: 23 mmol/L (ref 22–32)
Chloride: 104 mmol/L (ref 101–111)
Creatinine, Ser: 1.16 mg/dL (ref 0.61–1.24)
GFR calc Af Amer: >60 mL/min (ref >60)
Glucose, Bld: 105 mg/dL — ABNORMAL HIGH (ref 65–99)
POTASSIUM: 3.6 mmol/L (ref 3.5–5.1)
SODIUM: 138 mmol/L (ref 135–145)

## 2017-03-03 LAB — I-STAT TROPONIN, ED: TROPONIN I, POC: 0 ng/mL (ref 0.00–0.08)

## 2017-03-03 LAB — D-DIMER, QUANTITATIVE (NOT AT ARMC): D DIMER QUANT: 0.3 ug{FEU}/mL (ref 0.00–0.50)

## 2017-03-03 MED ORDER — ASPIRIN 325 MG PO TABS
325.0000 mg | ORAL_TABLET | Freq: Once | ORAL | Status: AC
  Administered 2017-03-03: 325 mg via ORAL
  Filled 2017-03-03: qty 1

## 2017-03-03 NOTE — ED Triage Notes (Signed)
Central chest pressure x 1 hour that radiates to right arm and back. PT endorses having diaphoresis when pain began. Denies n/v/ sob. VSS NAD

## 2017-03-03 NOTE — ED Provider Notes (Signed)
[facility] DEPT Provider Note   CSN: [id number] Arrival date & time: 03/03/17  2141     History   Chief Complaint Chief Complaint  Patient presents with  . Chest Pain    HPI Dennis Harvey is a 31 y.o. male.  HPI   Pt with hx DM p/w chest pain, back pain, left sided abdominal pain that came on suddenly while riding in a car earlier today.  Initially felt like gas but persisted.  Prior to the onset he felt a lump in his throat.  This occurred around 8:15pm.  The pain is described as pressure, 2/10 intensity.  Has had similar chest pressure 1-2x per week in the past month.  They last 15-20 minutes.  Usually occurs while he is driving.  No exacerbating or palliative factors when this occurs.  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  He does note that he has been SOB during sexual intercourse for the past month and his girlfriend has noted that he has been breathing more heavily and loudly recently.    Denies hx acid reflux, frequent use of NSAIDs or ETOH.  No family hx blood clots.  Father had cardiac stent placed at 76.    Past Medical History:  Diagnosis Date  . ARF (acute renal failure) ([place]) 05/18/2016  . Environmental allergies   . Type II diabetes mellitus ([place]) dxd 05/20/2016    Patient Active Problem List   Diagnosis Date Noted  . Acute renal failure (ARF) ([place]) 05/20/2016  . Hyperglycemia 05/20/2016  . Appendicitis 05/18/2016    Past Surgical History:  Procedure Laterality Date  . NO PAST SURGERIES         Home Medications    Prior to Admission medications   Medication Sig Start Date End Date Taking? Authorizing Provider  aspirin 81 MG chewable tablet Chew 1 tablet (81 mg total) by mouth daily. 03/04/17   Clayton Bibles, PA-C    Family History Family History  Problem Relation Age of Onset  . Diabetes Maternal Grandmother   . Heart failure Maternal Grandmother   . Hyperlipidemia Maternal Grandmother   .  Hypertension Maternal Grandmother   . Diabetes Father     Social History Social History  Substance Use Topics  . Smoking status: Former Smoker    Years: 2.00    Types: Cigars  . Smokeless tobacco: Never Used     Comment: 05/21/2016 "stopped smoking cigarettes in ~ 2007"  . Alcohol use 0.6 oz/week    1 Shots of liquor per week     Allergies   Patient has no known allergies.   Review of Systems Review of Systems  All other systems reviewed and are negative.    Physical Exam Updated Vital Signs BP (!) 108/59   Pulse (!) 59   Temp 97.7 F (36.5 C) (Oral)   Resp 14   Ht 5\' 8"  (1.727 m)   Wt 104.3 kg   SpO2 99%   BMI 34.97 kg/m   Physical Exam  Constitutional: He appears well-developed and well-nourished. No distress.  obese  HENT:  Head: Normocephalic and atraumatic.  Neck: Normal range of motion. Neck supple.  Cardiovascular: Normal rate and regular rhythm.   Pulmonary/Chest: Effort normal and breath sounds normal. No respiratory distress. He has no wheezes. He has no rales.  Abdominal: Soft. He exhibits no distension and no mass. There is tenderness (mild epigastric discomfort). There is no rebound and no  guarding.  Neurological: He is alert. He exhibits normal muscle tone.  Skin: He is not diaphoretic.  Nursing note and vitals reviewed.    ED Treatments / Results  Labs (all labs ordered are listed, but only abnormal results are displayed) Labs Reviewed  BASIC METABOLIC PANEL - Abnormal; Notable for the following:       Result Value   Glucose, Bld 105 (*)    All other components within normal limits  CBC  D-DIMER, QUANTITATIVE (NOT AT [facility])  Randolm Idol, ED  I-STAT TROPOININ, ED    EKG  EKG Interpretation  Date/Time:  Monday March 04 2017 01:42:10 EDT Ventricular Rate:  58 PR Interval:    QRS Duration: 101 QT Interval:  401 QTC Calculation: 394 R Axis:   39 Text Interpretation:  Sinus rhythm Early Repolarization Unchanged from prior ecg  from same date Confirmed by DELO  MD, DOUGLAS ([id number]) on 03/04/2017 1:46:59 AM       Radiology Dg Chest 2 View  Result Date: 03/03/2017 CLINICAL DATA:  Intermittent left-sided chest pain for several months. EXAM: CHEST  2 VIEW COMPARISON:  11/22/2016 FINDINGS: The lungs are clear. The pulmonary vasculature is normal. Heart size is normal. Hilar and mediastinal contours are unremarkable. There is no pleural effusion. IMPRESSION: No active cardiopulmonary disease. Electronically Signed   By: Andreas Newport M.D.   On: 03/03/2017 22:43    Procedures Procedures (including critical care time)  Medications Ordered in ED Medications  aspirin tablet 325 mg (325 mg Oral Given 03/03/17 2312)  gi cocktail (Maalox,Lidocaine,Donnatal) (30 mLs Oral Given 03/04/17 0146)     Initial Impression / Assessment and Plan / ED Course  I have reviewed the triage vital signs and the nursing notes.  Pertinent labs & imaging results that were available during my care of the patient were reviewed by me and considered in my medical decision making (see chart for details).     Afebrile nontoxic patient with chest pressure at rest today.  Has had "heavier breathing" and SOB with activity for the past month.  HEART score 1. EKG unremarkable.  Labs reassuring.  CXR negative.   Pt felt better with GI cocktail.  Doubt ACS, PE, PNA, dissection.  Given SOB with significant exertion, will refer to cardiology for further workup, possible stress test.  D/C home.  Discussed result, findings, treatment, and follow up  with patient.  Pt given return precautions.  Pt verbalizes understanding and agrees with plan.      Final Clinical Impressions(s) / ED Diagnoses   Final diagnoses:  Precordial pain    New Prescriptions Discharge Medication List as of 03/04/2017  1:36 AM    START taking these medications   Details  aspirin 81 MG chewable tablet Chew 1 tablet (81 mg total) by mouth daily., Starting Mon 03/04/2017, Print           [place], PA-C 03/04/17 0302    Daleen Bo, MD 03/04/17 [phone]

## 2017-03-03 NOTE — ED Notes (Signed)
Patient transported to X-ray 

## 2017-03-04 LAB — I-STAT TROPONIN, ED: Troponin i, poc: 0 ng/mL (ref 0.00–0.08)

## 2017-03-04 MED ORDER — GI COCKTAIL ~~LOC~~
30.0000 mL | Freq: Once | ORAL | Status: AC
  Administered 2017-03-04: 30 mL via ORAL
  Filled 2017-03-04: qty 30

## 2017-03-04 MED ORDER — ASPIRIN 81 MG PO CHEW: 81.0000 mg | CHEWABLE_TABLET | Freq: Every day | ORAL | 0 refills | Status: DC

## 2017-03-04 NOTE — Discharge Instructions (Signed)
Read the information below.  Use the prescribed medication as directed.  Please discuss all new medications with your pharmacist.  You may return to the Emergency Department at any time for worsening condition or any new symptoms that concern you.   If you develop worsening chest pain, shortness of breath, fever, you pass out, or become weak or dizzy, return to the ER for a recheck.    °

## 2017-03-05 ENCOUNTER — Ambulatory Visit (INDEPENDENT_AMBULATORY_CARE_PROVIDER_SITE_OTHER): Payer: Self-pay | Admitting: Cardiovascular Disease

## 2017-03-05 ENCOUNTER — Encounter: Payer: Self-pay | Admitting: Cardiovascular Disease

## 2017-03-05 VITALS — BP 130/89 | HR 73 | Ht 68.0 in | Wt 250.6 lb

## 2017-03-05 DIAGNOSIS — R0602 Shortness of breath: Secondary | ICD-10-CM

## 2017-03-05 DIAGNOSIS — R0789 Other chest pain: Secondary | ICD-10-CM

## 2017-03-05 MED ORDER — PANTOPRAZOLE SODIUM 40 MG PO TBEC: 40.0000 mg | DELAYED_RELEASE_TABLET | Freq: Every day | ORAL | 11 refills | Status: DC

## 2017-03-05 NOTE — Progress Notes (Signed)
03/05/2017 Bennett Scrape   June 24, 1986  [id number]  Primary Physician No PCP Per Patient Primary Cardiologist: Lorretta Harp MD Renae Gloss  HPI:  [name] is a 31 year old moderately overweight single African-American male father of 2 children who works as a Engineer, drilling. He is referred by the ER for evaluation atypical chest pain. He was seen there yesterday for chest pain with negative workup. His only risk factor is family history father who had a stent placed 60. Otherwise he smokes randomly and infrequently. Pain started several months ago occurring on a weekly or every other weekly basis lasting up to 15 minutes at a time. He has had some right upper extremity radiation as well as symptoms symptoms of dyspnea on exertion.   Current Outpatient Prescriptions  Medication Sig Dispense Refill  . aspirin 81 MG chewable tablet Chew 1 tablet (81 mg total) by mouth daily. 30 tablet 0   No current facility-administered medications for this visit.     No Known Allergies  Social History   Social History  . Marital status: Single    Spouse name: N/A  . Number of children: N/A  . Years of education: N/A   Occupational History  . truck driver    Social History Main Topics  . Smoking status: Former Smoker    Years: 2.00    Types: Cigars  . Smokeless tobacco: Never Used     Comment: 05/21/2016 "stopped smoking cigarettes in ~ 2007"  . Alcohol use 0.6 oz/week    1 Shots of liquor per week  . Drug use: No  . Sexual activity: Yes    Birth control/ protection: None   Other Topics Concern  . Not on file   Social History Narrative  . No narrative on file     Review of Systems: General: negative for chills, fever, night sweats or weight changes.  Cardiovascular: negative for chest pain, dyspnea on exertion, edema, orthopnea, palpitations, paroxysmal nocturnal dyspnea or shortness of breath Dermatological: negative for rash Respiratory: negative for  cough or wheezing Urologic: negative for hematuria Abdominal: negative for nausea, vomiting, diarrhea, bright red blood per rectum, melena, or hematemesis Neurologic: negative for visual changes, syncope, or dizziness All other systems reviewed and are otherwise negative except as noted above.    Blood pressure 130/89, pulse 73, height 5\' 8"  (1.727 m), weight 250 lb 9.6 oz (113.7 kg), SpO2 95 %.  General appearance: alert and no distress Neck: no adenopathy, no carotid bruit, no JVD, supple, symmetrical, trachea midline and thyroid not enlarged, symmetric, no tenderness/mass/nodules Lungs: clear to auscultation bilaterally Heart: regular rate and rhythm, S1, S2 normal, no murmur, click, rub or gallop Extremities: extremities normal, atraumatic, no cyanosis or edema  EKG sinus rhythm at 73 without ST or T-wave changes. I personally reviewed this EKG  ASSESSMENT AND PLAN:   Atypical chest pain Dennis Harvey is a 31 year old severely overweight single African-American male seen today for evaluation atypical chest pain. He was seen in the ER yesterday for evaluation of this as well. His only risk factors are family history the father who recently had a stent at age 33. He developed atypical chest pain several months ago occurring every week or every other week lasting for up to 15 minutes a time. He has noticed increasing shortness of breath on exertion. I'm going to get a routine GXT and a 2-D echocardiogram to further evaluate.      Lorretta Harp MD [place], [facility] 03/05/2017  10:30 AM

## 2017-03-05 NOTE — Assessment & Plan Note (Signed)
Dennis Harvey is a 31 year old severely overweight single African-American male seen today for evaluation atypical chest pain. He was seen in the ER yesterday for evaluation of this as well. His only risk factors are family history the father who recently had a stent at age 20. He developed atypical chest pain several months ago occurring every week or every other week lasting for up to 15 minutes a time. He has noticed increasing shortness of breath on exertion. I'm going to get a routine GXT and a 2-D echocardiogram to further evaluate.

## 2017-03-05 NOTE — Patient Instructions (Addendum)
Medication Instructions:  START taking protonix (pantoprazole) 40mg  ONCE DAILY  Labwork:  Lipid and liver function tests (fasting)   Testing/Procedures:  Your physician has requested that you have an echocardiogram. Echocardiography is a painless test that uses sound waves to create images of your heart. It provides your doctor with information about the size and shape of your heart and how well your heart's chambers and valves are working. This procedure takes approximately one hour. There are no restrictions for this procedure.  Your physician has requested that you have an exercise tolerance test. For further information please visit [email or web address]. Please also follow instruction sheet, as given.   Follow-Up: with Dr. Gwenlyn Found after recommended tests.     If you need a refill on your cardiac medications before your next appointment, please call your pharmacy.

## 2017-03-07 ENCOUNTER — Telehealth: Payer: Self-pay | Admitting: Cardiovascular Disease

## 2017-03-07 NOTE — Telephone Encounter (Signed)
New message   Pt wants a prescription for the generic brand of protonix   Because it was too expensive

## 2017-03-07 NOTE — Telephone Encounter (Signed)
Patient seen by Dr. Gwenlyn Found on Tuesday re: chest pain (previously eval'd in ED). Pt was prescribed pantoprazole, which he can get by Rx only. He voiced that the payment for the medication is $60 - he cannot afford this -- no insurance coverage and he is in gap while waiting on a Medicaid re-enrollment issue.  Would it be ok to recommend him OTC nexium or Prilosec for his symptoms? Routed to Dr. Gwenlyn Found to advise.

## 2017-03-08 NOTE — Telephone Encounter (Signed)
That is fine with me.

## 2017-03-08 NOTE — Telephone Encounter (Signed)
Per Dr Gwenlyn Found either medication OTC nexium or Prilosec   Left detailed message-DPR

## 2017-03-08 NOTE — Telephone Encounter (Signed)
Pt notified of Dr Gwenlyn Found recommendations

## 2017-03-14 ENCOUNTER — Telehealth (HOSPITAL_COMMUNITY): Payer: Self-pay

## 2017-03-14 NOTE — Telephone Encounter (Signed)
Encounter complete. 

## 2017-03-19 ENCOUNTER — Inpatient Hospital Stay (HOSPITAL_COMMUNITY): Admission: RE | Admit: 2017-03-19 | Payer: Medicaid Other | Source: Ambulatory Visit

## 2017-03-20 ENCOUNTER — Other Ambulatory Visit (HOSPITAL_COMMUNITY): Payer: Medicaid Other

## 2017-04-05 ENCOUNTER — Ambulatory Visit: Payer: Self-pay | Admitting: Cardiovascular Disease

## 2017-04-11 ENCOUNTER — Telehealth (HOSPITAL_COMMUNITY): Payer: Self-pay

## 2017-04-11 NOTE — Telephone Encounter (Signed)
Encounter complete. 

## 2017-04-14 ENCOUNTER — Emergency Department
Admission: EM | Admit: 2017-04-14 | Discharge: 2017-04-14 | Disposition: A | Discharge status: Home / Self Care | Payer: Self-pay | Attending: Emergency Medicine | Admitting: Emergency Medicine

## 2017-04-14 ENCOUNTER — Emergency Department: Payer: Self-pay

## 2017-04-14 ENCOUNTER — Encounter: Payer: Self-pay | Admitting: Emergency Medicine

## 2017-04-14 DIAGNOSIS — Z7982 Long term (current) use of aspirin: Secondary | ICD-10-CM | POA: Insufficient documentation

## 2017-04-14 DIAGNOSIS — Y9389 Activity, other specified: Secondary | ICD-10-CM | POA: Insufficient documentation

## 2017-04-14 DIAGNOSIS — K209 Esophagitis, unspecified without bleeding: Secondary | ICD-10-CM

## 2017-04-14 DIAGNOSIS — X58XXXA Exposure to other specified factors, initial encounter: Secondary | ICD-10-CM | POA: Insufficient documentation

## 2017-04-14 DIAGNOSIS — Y929 Unspecified place or not applicable: Secondary | ICD-10-CM | POA: Insufficient documentation

## 2017-04-14 DIAGNOSIS — Z87891 Personal history of nicotine dependence: Secondary | ICD-10-CM | POA: Insufficient documentation

## 2017-04-14 DIAGNOSIS — T189XXS Foreign body of alimentary tract, part unspecified, sequela: Secondary | ICD-10-CM

## 2017-04-14 DIAGNOSIS — Y999 Unspecified external cause status: Secondary | ICD-10-CM | POA: Insufficient documentation

## 2017-04-14 NOTE — ED Provider Notes (Signed)
[facility] Emergency Department Provider Note  ____________________________________________   First MD Initiated Contact with Patient 04/14/17 [phone]     (approximate)  I have reviewed the triage vital signs and the nursing notes.   HISTORY  Chief Complaint Swallowed Foreign Body    HPI Dennis Harvey is a 31 y.o. male is here with complaint of questionable foreign body. Patient states that 2 days ago he was drinking out of a [employer] juice container and felt something go down when he swallowed. He states that he is continued to eat normally since that time and last evening had grilled chicken, green beans and potatoes. He denies any difficulty talking, swallowing, or eating. He states that there is still a sensation of foreign body. Currently he rates his pain as a 0/10.   Past Medical History:  Diagnosis Date  . ARF (acute renal failure) ([place]) 05/18/2016  . Environmental allergies     Patient Active Problem List   Diagnosis Date Noted  . Atypical chest pain 03/05/2017  . Acute renal failure (ARF) ([place]) 05/20/2016  . Hyperglycemia 05/20/2016  . Appendicitis 05/18/2016    Past Surgical History:  Procedure Laterality Date  . NO PAST SURGERIES      Prior to Admission medications   Medication Sig Start Date End Date Taking? Authorizing Provider  aspirin 81 MG chewable tablet Chew 1 tablet (81 mg total) by mouth daily. 03/04/17   Clayton Bibles, PA-C    Allergies Patient has no known allergies.  Family History  Problem Relation Age of Onset  . Diabetes Maternal Grandmother   . Heart failure Maternal Grandmother   . Hyperlipidemia Maternal Grandmother   . Hypertension Maternal Grandmother   . Diabetes Father     Social History Social History  Substance Use Topics  . Smoking status: Former Smoker    Years: 2.00    Types: Cigars  . Smokeless tobacco: Never Used     Comment: 05/21/2016 "stopped smoking cigarettes in ~ 2007"  . Alcohol  use 0.6 oz/week    1 Shots of liquor per week    Review of Systems  Constitutional: No fever/chills ENT: Foreign body sensation 2 days. Cardiovascular: Denies chest pain. Respiratory: Denies shortness of breath. Gastrointestinal: No abdominal pain.  No nausea, no vomiting.   Neurological: Negative for headaches   ____________________________________________   PHYSICAL EXAM:  VITAL SIGNS: ED Triage Vitals  Enc Vitals Group     BP 04/14/17 0427 118/63     Pulse Rate 04/14/17 0427 65     Resp 04/14/17 0427 18     Temp 04/14/17 0427 97.9 F (36.6 C)     Temp Source 04/14/17 0427 Oral     SpO2 04/14/17 0427 97 %     Weight 04/14/17 0430 250 lb (113.4 kg)     Height 04/14/17 0430 5\' 8"  (1.727 m)     Head Circumference --      Peak Flow --      Pain Score 04/14/17 0427 0     Pain Loc --      Pain Edu? --      Excl. in [place]? --     Constitutional: Alert and oriented. Well appearing and in no acute distress. Eyes: Conjunctivae are normal. PERRL. EOMI. Head: Atraumatic. Nose: No congestion/rhinnorhea. Mouth/Throat: Mucous membranes are moist.  Oropharynx non-erythematous. Neck: No stridor.   Hematological/Lymphatic/Immunilogical: No cervical lymphadenopathy. Cardiovascular: Normal rate, regular rhythm. Grossly normal heart sounds.  Good peripheral circulation. Respiratory: Normal respiratory  effort.  No retractions. Lungs CTAB. Musculoskeletal: Moves upper and lower extremities without difficulty. Normal gait was noted. Neurologic:  Normal speech and language. No gross focal neurologic deficits are appreciated. No gait instability. Skin:  Skin is warm, dry and intact. No rash noted. Psychiatric: Mood and affect are normal. Speech and behavior are normal.  ____________________________________________   LABS (all labs ordered are listed, but only abnormal results are displayed)  Labs Reviewed - No data to display RADIOLOGY  Dg Neck Soft Tissue  Result Date:  04/14/2017 CLINICAL DATA:  Dysphagia after questionable swallowed foreign body EXAM: NECK SOFT TISSUES - 1+ VIEW COMPARISON:  None. FINDINGS: Frontal lateral views were obtained. No radiopaque foreign body evident. Epiglottis and aryepiglottic folds appear normal. Prevertebral soft tissues are normal. There is no air-fluid level to suggest abscess. Tongue base appears normal. No tonsillar or adenoidal hypertrophy. Bony structures appear intact. Lung apices are clear. IMPRESSION: No radiopaque foreign body. No soft tissue abnormality. Epiglottis and aryepiglottic folds appear normal. Electronically Signed   By: Lowella Grip III M.D.   On: 04/14/2017 07:41   Dg Abdomen 1 View  Result Date: 04/14/2017 CLINICAL DATA:  Possibly swallowed a metal object. EXAM: ABDOMEN - 1 VIEW COMPARISON:  None. FINDINGS: No disproportionate dilatation of bowel. No obvious free intraperitoneal gas. There are no radiopaque foreign objects projecting over the abdomen or pelvis. IMPRESSION: No evidence of ingested metal.  Nonobstructive bowel gas pattern. Electronically Signed   By: Marybelle Killings M.D.   On: 04/14/2017 07:39    ____________________________________________   PROCEDURES  Procedure(s) performed: None  Procedures  Critical Care performed: No  ____________________________________________   INITIAL IMPRESSION / ASSESSMENT AND PLAN / ED COURSE  Pertinent labs & imaging results that were available during my care of the patient were reviewed by me and considered in my medical decision making (see chart for details).  Patient was reassured that there was no opaque foreign body noted in the abdomen or esophagus. Patient will continue on soft diet today and continued problems then follow-up with gastroenterology for possible upper GI endoscopy. He is to return to the emergency room if any severe worsening of his symptoms.    ___________________________________________   FINAL CLINICAL IMPRESSION(S) /  ED DIAGNOSES  Final diagnoses:  Acute esophagitis      NEW MEDICATIONS STARTED DURING THIS VISIT:  Discharge Medication List as of 04/14/2017  8:02 AM       Note:  This document was prepared using Dragon voice recognition software and may include unintentional dictation errors.    Johnn Hai, PA-C 04/14/17 1304    Lisa Roca, MD 04/14/17 [phone]

## 2017-04-14 NOTE — ED Triage Notes (Signed)
Pt arrives ambulatory to triage with c/o of swallowing "something out of the [employer] juice container". Pt states that Friday evening he went to get some juice and felt something go down when he swallowed. Pt reports that the "something" has not cleared yet despite pt eating and drinking as normal since that time. Pt is in NAD at this time.

## 2017-04-14 NOTE — ED Notes (Signed)
NAD noted at time of D/C. Pt denies questions or concerns. Pt ambulatory to the lobby at this time.  

## 2017-04-14 NOTE — ED Notes (Signed)
Patient reports Friday while drinking lemonade felt like a piece of hard plastic got stuck in his throat.  Reports ate some bread on Saturday and when it was going down felt like it was getting stuck on something.  Report difficulty swallowing since then.  Patient speaking in complete sentences, controlling own secretions.  Patient with no acute respiratory distress noted.

## 2017-04-14 NOTE — ED Notes (Signed)
Pt states Friday night was drinking juice from a cardboard [employer] juice carton, pt states poured it in a cup when he thought he saw a "plastic piece" in the cup, pt states continued to drink and thought he felt something go down his throat. Pt states has been able to eat and drink since then but has the continued sensation of something being caught in his throat. Pt is maintaining his own saliva at this time, NAD noted, respirations even and unlabored.

## 2017-04-14 NOTE — Discharge Instructions (Signed)
Continuous soft foods for the next to 3 days. Drink lots of fluids. This could be a scratch on your throat that you're feeling however if continued problems you will need to follow-up with the gastroenterologist listed on your discharge papers. Call and make an appointment.

## 2017-04-16 ENCOUNTER — Other Ambulatory Visit (HOSPITAL_COMMUNITY): Payer: Medicaid Other

## 2017-04-16 ENCOUNTER — Ambulatory Visit (HOSPITAL_COMMUNITY)
Admission: RE | Admit: 2017-04-16 | Payer: Medicaid Other | Source: Ambulatory Visit | Attending: Cardiovascular Disease | Admitting: Cardiovascular Disease

## 2017-04-20 ENCOUNTER — Encounter (HOSPITAL_COMMUNITY): Payer: Self-pay | Admitting: Emergency Medicine

## 2017-04-20 ENCOUNTER — Emergency Department (HOSPITAL_COMMUNITY)
Admission: EM | Admit: 2017-04-20 | Discharge: 2017-04-20 | Disposition: A | Discharge status: Home / Self Care | Payer: Medicaid Other | Attending: Emergency Medicine | Admitting: Emergency Medicine

## 2017-04-20 DIAGNOSIS — Z87891 Personal history of nicotine dependence: Secondary | ICD-10-CM | POA: Insufficient documentation

## 2017-04-20 DIAGNOSIS — R1310 Dysphagia, unspecified: Secondary | ICD-10-CM

## 2017-04-20 DIAGNOSIS — Z7982 Long term (current) use of aspirin: Secondary | ICD-10-CM | POA: Insufficient documentation

## 2017-04-20 DIAGNOSIS — K219 Gastro-esophageal reflux disease without esophagitis: Secondary | ICD-10-CM

## 2017-04-20 DIAGNOSIS — K210 Gastro-esophageal reflux disease with esophagitis: Secondary | ICD-10-CM | POA: Insufficient documentation

## 2017-04-20 MED ORDER — GI COCKTAIL ~~LOC~~
30.0000 mL | Freq: Once | ORAL | Status: AC
  Administered 2017-04-20: 30 mL via ORAL
  Filled 2017-04-20: qty 30

## 2017-04-20 MED ORDER — SUCRALFATE 1 GM/10ML PO SUSP
1.0000 g | Freq: Three times a day (TID) | ORAL | 1 refills | Status: DC
Start: 2017-04-20 — End: 2017-06-26

## 2017-04-20 MED ORDER — FAMOTIDINE 20 MG PO TABS
40.0000 mg | ORAL_TABLET | Freq: Once | ORAL | Status: AC
  Administered 2017-04-20: 40 mg via ORAL
  Filled 2017-04-20: qty 2

## 2017-04-20 MED ORDER — OMEPRAZOLE 20 MG PO CPDR: 20.0000 mg | DELAYED_RELEASE_CAPSULE | Freq: Every day | ORAL | 1 refills | Status: DC

## 2017-04-20 NOTE — ED Notes (Signed)
While attempting to discuss discharge instructions patient on cell phone, asked this RN "to hold off for a few minutes so he can take care of something."

## 2017-04-20 NOTE — ED Triage Notes (Signed)
Pt reports that he woke up feeling as if something was stuck in his throat this morning.  Report no trouble breathing or speaking, no pain just uncomfortable.  No SOB, states feels he has trouble swallowing.

## 2017-04-20 NOTE — Discharge Instructions (Signed)
Take omeprazole (Prilosec) every day.  Follow dietary instructions for GERD.  Take Carafate for the next 2 weeks.  Schedule follow-up with a gastroenterologist as soon as possible.

## 2017-04-20 NOTE — ED Notes (Signed)
Pt voiced understanding of prescriptions and discharge instructions. NAD. Ambulatory

## 2017-04-20 NOTE — ED Provider Notes (Signed)
[facility] DEPT Provider Note   CSN: [id number] Arrival date & time: 04/20/17  0547     History   Chief Complaint Chief Complaint  Patient presents with  . object stuck in thoat    HPI Dennis Harvey is a 31 y.o. male.  HPI Patient reports he went to bed feeling fine last night. This morning he awoke with a feeling of fullness in his throat and a lump with swallowing. He reports over the past several weeks and months he does be seen in the emergency department several times. He reports he was having problems with chest discomfort that "wasn't his heart". He reports he had been diagnosed with reflux and was taking Nexium. He reports that the symptoms had overall significantly improved so he stopped taking Nexium about 3 days ago. Patient reports fairly late in the evening he had hot dogs and french fries for dinner. He reports that he has been detailed eat food all right and doesn't really feel like it sticks but he does feel like his Nexium caplet sticks when he swallows it. He has no other associated symptoms of shortness of breath diaphoresis or abdominal pain. ENT symptoms negative as well. Past Medical History:  Diagnosis Date  . ARF (acute renal failure) ([place]) 05/18/2016  . Environmental allergies     Patient Active Problem List   Diagnosis Date Noted  . Atypical chest pain 03/05/2017  . Acute renal failure (ARF) ([place]) 05/20/2016  . Hyperglycemia 05/20/2016  . Appendicitis 05/18/2016    Past Surgical History:  Procedure Laterality Date  . NO PAST SURGERIES         Home Medications    Prior to Admission medications   Medication Sig Start Date End Date Taking? Authorizing Provider  aspirin 81 MG chewable tablet Chew 1 tablet (81 mg total) by mouth daily. 03/04/17   Clayton Bibles, PA-C  omeprazole (PRILOSEC) 20 MG capsule Take 1 capsule (20 mg total) by mouth daily. 04/20/17   Charlesetta Shanks, MD  sucralfate (CARAFATE) 1 GM/10ML suspension Take 10 mLs (1 g total) by  mouth 4 (four) times daily -  with meals and at bedtime. 04/20/17   Charlesetta Shanks, MD    Family History Family History  Problem Relation Age of Onset  . Diabetes Maternal Grandmother   . Heart failure Maternal Grandmother   . Hyperlipidemia Maternal Grandmother   . Hypertension Maternal Grandmother   . Diabetes Father     Social History Social History  Substance Use Topics  . Smoking status: Former Smoker    Years: 2.00    Types: Cigars  . Smokeless tobacco: Never Used     Comment: 05/21/2016 "stopped smoking cigarettes in ~ 2007"  . Alcohol use 0.6 oz/week    1 Shots of liquor per week     Allergies   Patient has no known allergies.   Review of Systems Review of Systems 10 Systems reviewed and are negative for acute change except as noted in the HPI.   Physical Exam Updated Vital Signs BP 113/67 (BP Location: Right Arm)   Pulse 98   Temp 98.6 F (37 C) (Oral)   Resp 18   SpO2 98%   Physical Exam  Constitutional: He is oriented to person, place, and time. He appears well-developed and well-nourished.  HENT:  Head: Normocephalic and atraumatic.  Nose: Nose normal.  Mouth/Throat: Oropharynx is clear and moist.  Posterior WIDELY patent. No intraoral swelling. Good condition of dentition and mucous membranes. Neck is supple. No  palpable mass. No stridor.  Eyes: Conjunctivae are normal.  Neck: Neck supple.  Cardiovascular: Normal rate and regular rhythm.   No murmur heard. Pulmonary/Chest: Effort normal and breath sounds normal. No respiratory distress.  Abdominal: Soft. There is no tenderness.  Musculoskeletal: He exhibits no edema.  Neurological: He is alert and oriented to person, place, and time. No cranial nerve deficit. He exhibits normal muscle tone. Coordination normal.  Skin: Skin is warm and dry.  Psychiatric: He has a normal mood and affect.  Nursing note and vitals reviewed.    ED Treatments / Results  Labs (all labs ordered are listed, but  only abnormal results are displayed) Labs Reviewed - No data to display  EKG  EKG Interpretation None       Radiology No results found.  Procedures Procedures (including critical care time)  Medications Ordered in ED Medications  gi cocktail (Maalox,Lidocaine,Donnatal) (not administered)  famotidine (PEPCID) tablet 40 mg (not administered)     Initial Impression / Assessment and Plan / ED Course  I have reviewed the triage vital signs and the nursing notes.  Pertinent labs & imaging results that were available during my care of the patient were reviewed by me and considered in my medical decision making (see chart for details).     Final Clinical Impressions(s) / ED Diagnoses   Final diagnoses:  Gastroesophageal reflux disease, esophagitis presence not specified  Dysphagia, unspecified type   Patient globus sensation\dysphagia with pills. History is very suggestive of GERD. Patient had resolution of symptoms of chest discomfort from several weeks ago after taking Nexium consistently. He just stopped taking Nexium 3 days ago. No evidence of airway involvement. Patient is counseled on daily PPI, he is to try omeprazole coated tablet. 2 weeks of Carafate. Patient is counseled on dietary measures. He is made aware of the necessity of follow-up for upper endoscopy for further assessment to rule out stricture or other possible obstructive processes of the esophagus.  New Prescriptions New Prescriptions   OMEPRAZOLE (PRILOSEC) 20 MG CAPSULE    Take 1 capsule (20 mg total) by mouth daily.   SUCRALFATE (CARAFATE) 1 GM/10ML SUSPENSION    Take 10 mLs (1 g total) by mouth 4 (four) times daily -  with meals and at bedtime.     Charlesetta Shanks, MD 04/20/17 [id number]

## 2017-04-20 NOTE — ED Notes (Signed)
Pt ambulated to room from waiting area with no complaints, pt ambulated with a steady gait.

## 2017-04-22 ENCOUNTER — Encounter: Payer: Self-pay | Admitting: Cardiovascular Disease

## 2017-05-03 ENCOUNTER — Telehealth (HOSPITAL_COMMUNITY): Payer: Self-pay | Admitting: Cardiovascular Disease

## 2017-05-03 ENCOUNTER — Ambulatory Visit: Payer: Self-pay | Admitting: Cardiovascular Disease

## 2017-05-03 NOTE — Telephone Encounter (Signed)
Patient has no-showed on 03/20/17 and 04/16/17  04/22/17 PATIENT NS---6/12 AND CANCEL ON 04/19/17,LETTER MAILED TODAY./D.MILLER   User: Cherie Dark A Date/time: 04/30/17 11:14 AM  Comment: Spoke with pt and he voiced that he needed to check with his insurance and would CB at the end of the week.   Context: [place] Schedule Orders/Appt Requests Outcome: Completed  Phone number: [phone] Phone Type: Home Phone  Comm. type: Telephone Call type: Incoming  Contact: Wan, Amare Relation to patient: Self     He will be removed from the workqueue.

## 2017-05-30 ENCOUNTER — Emergency Department
Admission: EM | Admit: 2017-05-30 | Discharge: 2017-05-31 | Disposition: A | Discharge status: Home / Self Care | Payer: Medicaid Other | Attending: Emergency Medicine | Admitting: Emergency Medicine

## 2017-05-30 ENCOUNTER — Encounter: Payer: Self-pay | Admitting: *Deleted

## 2017-05-30 DIAGNOSIS — R079 Chest pain, unspecified: Secondary | ICD-10-CM

## 2017-05-30 DIAGNOSIS — R42 Dizziness and giddiness: Secondary | ICD-10-CM | POA: Insufficient documentation

## 2017-05-30 DIAGNOSIS — R070 Pain in throat: Secondary | ICD-10-CM

## 2017-05-30 DIAGNOSIS — Z79899 Other long term (current) drug therapy: Secondary | ICD-10-CM | POA: Insufficient documentation

## 2017-05-30 DIAGNOSIS — Z7982 Long term (current) use of aspirin: Secondary | ICD-10-CM | POA: Insufficient documentation

## 2017-05-30 DIAGNOSIS — K219 Gastro-esophageal reflux disease without esophagitis: Secondary | ICD-10-CM

## 2017-05-30 DIAGNOSIS — Z87891 Personal history of nicotine dependence: Secondary | ICD-10-CM | POA: Insufficient documentation

## 2017-05-30 DIAGNOSIS — N179 Acute kidney failure, unspecified: Secondary | ICD-10-CM | POA: Insufficient documentation

## 2017-05-30 LAB — BASIC METABOLIC PANEL
Anion gap: 7 (ref 5–15)
BUN: 15 mg/dL (ref 6–20)
CHLORIDE: 108 mmol/L (ref 101–111)
CO2: 24 mmol/L (ref 22–32)
Calcium: 8.9 mg/dL (ref 8.9–10.3)
Creatinine, Ser: 1.21 mg/dL (ref 0.61–1.24)
GFR calc non Af Amer: >60 mL/min (ref >60)
Glucose, Bld: 167 mg/dL — ABNORMAL HIGH (ref 65–99)
Potassium: 3.5 mmol/L (ref 3.5–5.1)
Sodium: 139 mmol/L (ref 135–145)

## 2017-05-30 LAB — CBC
HCT: 42 % (ref 40.0–52.0)
Hemoglobin: 14.2 g/dL (ref 13.0–18.0)
MCH: 30.1 pg (ref 26.0–34.0)
MCHC: 33.8 g/dL (ref 32.0–36.0)
MCV: 89 fL (ref 80.0–100.0)
PLATELETS: 323 10*3/uL (ref 150–440)
RBC: 4.71 MIL/uL (ref 4.40–5.90)
RDW: 12.8 % (ref 11.5–14.5)
WBC: 7 10*3/uL (ref 3.8–10.6)

## 2017-05-30 LAB — TROPONIN I: Troponin I: <0.03 ng/mL (ref <0.03)

## 2017-05-30 MED ORDER — FAMOTIDINE 20 MG PO TABS: 20.0000 mg | ORAL_TABLET | Freq: Every day | ORAL | 0 refills | Status: DC

## 2017-05-30 MED ORDER — FAMOTIDINE 20 MG PO TABS
40.0000 mg | ORAL_TABLET | Freq: Once | ORAL | Status: AC
  Administered 2017-05-30: 40 mg via ORAL
  Filled 2017-05-30: qty 2

## 2017-05-30 NOTE — ED Provider Notes (Signed)
[facility] Emergency Department Provider Note  ____________________________________________   First MD Initiated Contact with Patient 05/30/17 2305     (approximate)  I have reviewed the triage vital signs and the nursing notes.   HISTORY  Chief Complaint Sore Throat and Dizziness   HPI Dennis Harvey is a 31 y.o. male with a history of acute renal failure who is presenting to the emergency department with chest pressure, throat pain as well as fatigue over the past week. He has had multiple episodes of this especially over the past year has been diagnosed with esophagitis secondary to reflux. He says that he feels like he has a lump in his throat and it hurts when he swallows. He has not reported any cough, runny nose or fever. Said that he became concerned tonight after going from a nap at about 7:30 PM with chest pressure that he described as a 10 out of 10 which lasted for about 1-2 hours. He says that he felt short of breath during this time. Denies the pain having any radiation. Says the pain was to the center of his chest. He says that belching helps relieve the "pressure." He says that he was taking Nexium earlier this year which was helping but he ran out of the medication. Patient does not smoke. Says that his father had a heart attack was in his 27s. Patient is denying any pain at this time, shortness of breath, nausea or vomiting or diaphoresis. Denies any spinning, dizzy sensation but does say that he was feeling lightheaded throughout the week and had intermittent pressure to his chest throughout the week but none that was as bad as tonight.   Past Medical History:  Diagnosis Date  . ARF (acute renal failure) ([place]) 05/18/2016  . Environmental allergies     Patient Active Problem List   Diagnosis Date Noted  . Atypical chest pain 03/05/2017  . Acute renal failure (ARF) ([place]) 05/20/2016  . Hyperglycemia 05/20/2016  . Appendicitis 05/18/2016     Past Surgical History:  Procedure Laterality Date  . NO PAST SURGERIES      Prior to Admission medications   Medication Sig Start Date End Date Taking? Authorizing Provider  aspirin 81 MG chewable tablet Chew 1 tablet (81 mg total) by mouth daily. 03/04/17   Clayton Bibles, PA-C  omeprazole (PRILOSEC) 20 MG capsule Take 1 capsule (20 mg total) by mouth daily. 04/20/17   Charlesetta Shanks, MD  sucralfate (CARAFATE) 1 GM/10ML suspension Take 10 mLs (1 g total) by mouth 4 (four) times daily -  with meals and at bedtime. 04/20/17   Charlesetta Shanks, MD    Allergies Patient has no known allergies.  Family History  Problem Relation Age of Onset  . Diabetes Maternal Grandmother   . Heart failure Maternal Grandmother   . Hyperlipidemia Maternal Grandmother   . Hypertension Maternal Grandmother   . Diabetes Father     Social History Social History  Substance Use Topics  . Smoking status: Former Smoker    Years: 2.00    Types: Cigars  . Smokeless tobacco: Never Used     Comment: 05/21/2016 "stopped smoking cigarettes in ~ 2007"  . Alcohol use 0.6 oz/week    1 Shots of liquor per week    Review of Systems  Constitutional: No fever/chills Eyes: No visual changes. ENT: No sore throat. Cardiovascular: as above Respiratory: as above Gastrointestinal: No abdominal pain.  No nausea, no vomiting.  No diarrhea.  No constipation. Genitourinary:  Negative for dysuria. Musculoskeletal: Negative for back pain. Skin: Negative for rash. Neurological: Negative for headaches, focal weakness or numbness.   ____________________________________________   PHYSICAL EXAM:  VITAL SIGNS: ED Triage Vitals  Enc Vitals Group     BP 05/30/17 2054 137/77     Pulse Rate 05/30/17 2054 81     Resp 05/30/17 2054 20     Temp 05/30/17 2052 98.5 F (36.9 C)     Temp Source 05/30/17 2052 Oral     SpO2 05/30/17 2054 98 %     Weight 05/30/17 2054 250 lb (113.4 kg)     Height 05/30/17 2054 5\' 8"  (1.727 m)      Head Circumference --      Peak Flow --      Pain Score 05/30/17 2053 0     Pain Loc --      Pain Edu? --      Excl. in [place]? --     Constitutional: Alert and oriented. Well appearing and in no acute distress. Eyes: Conjunctivae are normal.  Head: Atraumatic. Nose: No congestion/rhinnorhea. Mouth/Throat: Mucous membranes are moist. No pharyngeal erythema. No tonsillar swelling or uvular swelling. No tenderness to palpation to the anterior neck. No palpable lymphadenopathy or other masses. Thyroid appears normal. Neck: No stridor.   Cardiovascular: Normal rate, regular rhythm. Grossly normal heart sounds.  Good peripheral circulation With equal and bilateral radial as well as dorsalis pedis pulses. Respiratory: Normal respiratory effort.  No retractions. Lungs CTAB. Gastrointestinal: Soft and nontender. No distention. No CVA tenderness. Musculoskeletal: No lower extremity tenderness nor edema.  No joint effusions. Neurologic:  Normal speech and language. No gross focal neurologic deficits are appreciated. Skin:  Skin is warm, dry and intact. No rash noted. Psychiatric: Mood and affect are normal. Speech and behavior are normal.  ____________________________________________   LABS (all labs ordered are listed, but only abnormal results are displayed)  Labs Reviewed  BASIC METABOLIC PANEL - Abnormal; Notable for the following:       Result Value   Glucose, Bld 167 (*)    All other components within normal limits  CBC  TROPONIN I   ____________________________________________  EKG  ED ECG REPORT I, Doran Stabler, the attending physician, personally viewed and interpreted this ECG.   Date: 05/30/2017  EKG Time: 2056  Rate: 77  Rhythm: normal sinus rhythm  Axis: Normal  Intervals:none  ST&T Change: No ST segment elevation or depression. No abnormal T-wave  inversion. ____________________________________________  RADIOLOGY   ____________________________________________   PROCEDURES  Procedure(s) performed:   Procedures  Critical Care performed:   ____________________________________________   INITIAL IMPRESSION / ASSESSMENT AND PLAN / ED COURSE  Pertinent labs & imaging results that were available during my care of the patient were reviewed by me and considered in my medical decision making (see chart for details).  PERC negative. Heart score of 2. Patient appears well this time. Very reassuring workup. Reviewing his record he has had multiple previous admissions just this past year with similar complaints. All with reassuring workups. We discussed further workup in the emergency department including a chest x-ray as well as further blood work but he would rather follow-up in the office. He says that the above inhibitors have been prohibitively expensive. We will try him on an H2 blocker. 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 Very unlikely to be pulmonary embolus especially with  his vital signs at this time with heart rate in 60s while I was examining him. I will give the patient both cardiology and get her neurology follow-up. He is understanding of the plan and willing to comply.      ____________________________________________   FINAL CLINICAL IMPRESSION(S) / ED DIAGNOSES  Chest pain. Throat pain. GERD.    NEW MEDICATIONS STARTED DURING THIS VISIT:  New Prescriptions   No medications on file     Note:  This document was prepared using Dragon voice recognition software and may include unintentional dictation errors.     Orbie Pyo, MD 05/30/17 [phone]

## 2017-05-30 NOTE — ED Triage Notes (Signed)
Pt ambulatory to traige.  Pt has a sore throat for 1 wee.  Pt also reports feeling lightheaded for over a week.  Intermittent chest pain .   No sob.  Pt alert.   Speech clear.

## 2017-05-31 NOTE — ED Notes (Signed)
NAD noted at time of D/C. Pt denies questions or concerns. Pt ambulatory to the lobby at this time.  

## 2017-06-19 ENCOUNTER — Encounter: Payer: Self-pay | Admitting: Internal Medicine

## 2017-06-22 ENCOUNTER — Emergency Department: Payer: Self-pay

## 2017-06-22 ENCOUNTER — Emergency Department
Admission: EM | Admit: 2017-06-22 | Discharge: 2017-06-22 | Disposition: A | Discharge status: Home / Self Care | Payer: Self-pay | Attending: Emergency Medicine | Admitting: Emergency Medicine

## 2017-06-22 DIAGNOSIS — Z79899 Other long term (current) drug therapy: Secondary | ICD-10-CM | POA: Insufficient documentation

## 2017-06-22 DIAGNOSIS — Z7982 Long term (current) use of aspirin: Secondary | ICD-10-CM | POA: Insufficient documentation

## 2017-06-22 DIAGNOSIS — Z87891 Personal history of nicotine dependence: Secondary | ICD-10-CM | POA: Insufficient documentation

## 2017-06-22 DIAGNOSIS — R079 Chest pain, unspecified: Secondary | ICD-10-CM | POA: Insufficient documentation

## 2017-06-22 LAB — COMPREHENSIVE METABOLIC PANEL
ALK PHOS: 53 U/L (ref 38–126)
ALT: 19 U/L (ref 17–63)
AST: 20 U/L (ref 15–41)
Albumin: 3.9 g/dL (ref 3.5–5.0)
Anion gap: 5 (ref 5–15)
BILIRUBIN TOTAL: 0.7 mg/dL (ref 0.3–1.2)
BUN: 14 mg/dL (ref 6–20)
CALCIUM: 8.7 mg/dL — AB (ref 8.9–10.3)
CHLORIDE: 107 mmol/L (ref 101–111)
CO2: 27 mmol/L (ref 22–32)
CREATININE: 1.21 mg/dL (ref 0.61–1.24)
GFR calc non Af Amer: >60 mL/min (ref >60)
Glucose, Bld: 107 mg/dL — ABNORMAL HIGH (ref 65–99)
Potassium: 3.9 mmol/L (ref 3.5–5.1)
Sodium: 139 mmol/L (ref 135–145)
TOTAL PROTEIN: 7.5 g/dL (ref 6.5–8.1)

## 2017-06-22 LAB — CBC
HEMATOCRIT: 41.7 % (ref 40.0–52.0)
HEMOGLOBIN: 14.1 g/dL (ref 13.0–18.0)
MCH: 30.4 pg (ref 26.0–34.0)
MCHC: 33.8 g/dL (ref 32.0–36.0)
MCV: 90.1 fL (ref 80.0–100.0)
PLATELETS: 303 10*3/uL (ref 150–440)
RBC: 4.62 MIL/uL (ref 4.40–5.90)
RDW: 13.3 % (ref 11.5–14.5)
WBC: 7.6 10*3/uL (ref 3.8–10.6)

## 2017-06-22 LAB — POCT RAPID STREP A: Streptococcus, Group A Screen (Direct): NEGATIVE

## 2017-06-22 LAB — TROPONIN I

## 2017-06-22 NOTE — Discharge Instructions (Signed)
Please seek medical attention for any high fevers, chest pain, shortness of breath, change in behavior, persistent vomiting, bloody stool or any other new or concerning symptoms.  

## 2017-06-22 NOTE — ED Provider Notes (Signed)
[facility] Emergency Department Provider Note    ____________________________________________   I have reviewed the triage vital signs and the nursing notes.   HISTORY  Chief Complaint Chest Pain   History limited by: Not Limited   HPI Dennis Harvey is a 31 y.o. male who presents to the emergency department today with primary complaint of chest pain. It is located on the right side of his chest. States he feels like there is a ball there. It started last night and prevented him from being able to sleep. In addition he complains of feeling of a blockage in his throat on the right side. This has been present for the past 2 days.He has been seen in the emergency department multiple times in the past for chest pain although states today's episode feels somewhat different. He states he has a primary care appointment scheduled on Wednesday of next week.    Past Medical History:  Diagnosis Date  . ARF (acute renal failure) ([place]) 05/18/2016  . Environmental allergies     Patient Active Problem List   Diagnosis Date Noted  . Atypical chest pain 03/05/2017  . Acute renal failure (ARF) ([place]) 05/20/2016  . Hyperglycemia 05/20/2016  . Appendicitis 05/18/2016    Past Surgical History:  Procedure Laterality Date  . NO PAST SURGERIES      Prior to Admission medications   Medication Sig Start Date End Date Taking? Authorizing Provider  aspirin 81 MG chewable tablet Chew 1 tablet (81 mg total) by mouth daily. 03/04/17   Clayton Bibles, PA-C  famotidine (PEPCID) 20 MG tablet Take 1 tablet (20 mg total) by mouth daily. 05/30/17 05/30/18  Orbie Pyo, MD  omeprazole (PRILOSEC) 20 MG capsule Take 1 capsule (20 mg total) by mouth daily. 04/20/17   Charlesetta Shanks, MD  sucralfate (CARAFATE) 1 GM/10ML suspension Take 10 mLs (1 g total) by mouth 4 (four) times daily -  with meals and at bedtime. 04/20/17   Charlesetta Shanks, MD    Allergies Patient has no known  allergies.  Family History  Problem Relation Age of Onset  . Diabetes Maternal Grandmother   . Heart failure Maternal Grandmother   . Hyperlipidemia Maternal Grandmother   . Hypertension Maternal Grandmother   . Diabetes Father     Social History Social History  Substance Use Topics  . Smoking status: Former Smoker    Years: 2.00    Types: Cigars  . Smokeless tobacco: Never Used     Comment: 05/21/2016 "stopped smoking cigarettes in ~ 2007"  . Alcohol use 0.6 oz/week    1 Shots of liquor per week    Review of Systems Constitutional: No fever/chills Eyes: No visual changes. ENT: positive for sensation of throat blockage Cardiovascular: positive forchest pain. Respiratory: Denies shortness of breath. Gastrointestinal: No abdominal pain.  No nausea, no vomiting.  No diarrhea.   Genitourinary: Negative for dysuria. Musculoskeletal: Negative for back pain. Skin: Negative for rash. Neurological: Negative for headaches, focal weakness or numbness.  ____________________________________________   PHYSICAL EXAM:  VITAL SIGNS: ED Triage Vitals  Enc Vitals Group     BP 06/22/17 0507 111/64     Pulse Rate 06/22/17 0507 (!) 59     Resp 06/22/17 0507 18     Temp 06/22/17 0507 98.6 F (37 C)     Temp Source 06/22/17 0507 Oral     SpO2 06/22/17 0507 97 %     Weight 06/22/17 0503 245 lb (111.1 kg)  Height 06/22/17 0503 5\' 8"  (1.727 m)     Head Circumference --      Peak Flow --      Pain Score 06/22/17 0503 0    Constitutional: Alert and oriented. Well appearing and in no distress. Eyes: Conjunctivae are normal.  ENT   Head: Normocephalic and atraumatic.   Nose: No congestion/rhinnorhea.   Mouth/Throat: Mucous membranes are moist.   Neck: No stridor. Hematological/Lymphatic/Immunilogical: No cervical lymphadenopathy. Cardiovascular: Normal rate, regular rhythm.  No murmurs, rubs, or gallops.  Respiratory: Normal respiratory effort without tachypnea nor  retractions. Breath sounds are clear and equal bilaterally. No wheezes/rales/rhonchi. Gastrointestinal: Soft and non tender. No rebound. No guarding.  Genitourinary: Deferred Musculoskeletal: Normal range of motion in all extremities. No lower extremity edema. Neurologic:  Normal speech and language. No gross focal neurologic deficits are appreciated.  Skin:  Skin is warm, dry and intact. No rash noted. Psychiatric: Mood and affect are normal. Speech and behavior are normal. Patient exhibits appropriate insight and judgment.  ____________________________________________    LABS (pertinent positives/negatives)  Labs Reviewed  COMPREHENSIVE METABOLIC PANEL - Abnormal; Notable for the following:       Result Value   Glucose, Bld 107 (*)    Calcium 8.7 (*)    All other components within normal limits  CBC  TROPONIN I  POCT RAPID STREP A     ____________________________________________   EKG  I, Nance Pear, attending physician, personally viewed and interpreted this EKG  EKG Time: 0502 Rate: 57 Rhythm: sinus bradycardia Axis: normal Intervals: qtc 385 QRS: LVH ST changes: no st elevation Impression: abnormal ekg   ____________________________________________    RADIOLOGY  CXR IMPRESSION:  Stable normal chest.     ____________________________________________   PROCEDURES  Procedures  ____________________________________________   INITIAL IMPRESSION / ASSESSMENT AND PLAN / ED COURSE  Pertinent labs & imaging results that were available during my care of the patient were reviewed by me and considered in my medical decision making (see chart for details).  Patient presented to the emergency department today because of concerns for chest pain and continued throat issues. Workup here in the emergency department without any concerning findings. Physical exam without any concerning findings. Will plan to discharge and follow-up with primary care  Wednesday.  ____________________________________________   FINAL CLINICAL IMPRESSION(S) / ED DIAGNOSES  Final diagnoses:  Nonspecific chest pain     Note: This dictation was prepared with Dragon dictation. Any transcriptional errors that result from this process are unintentional     Nance Pear, MD 06/22/17 [phone]

## 2017-06-22 NOTE — ED Triage Notes (Signed)
Pt in with co right sided chest pain that started tonight, states is worse when he moves or takes a deep breath. Also co sore throat since Thursday. Pt denies any recent cough.

## 2017-06-26 ENCOUNTER — Encounter: Payer: Self-pay | Admitting: Internal Medicine

## 2017-06-26 ENCOUNTER — Ambulatory Visit (INDEPENDENT_AMBULATORY_CARE_PROVIDER_SITE_OTHER): Payer: Self-pay | Admitting: Internal Medicine

## 2017-06-26 VITALS — BP 118/70 | HR 84 | Ht 67.0 in | Wt 240.0 lb

## 2017-06-26 DIAGNOSIS — F458 Other somatoform disorders: Secondary | ICD-10-CM

## 2017-06-26 DIAGNOSIS — R0989 Other specified symptoms and signs involving the circulatory and respiratory systems: Secondary | ICD-10-CM

## 2017-06-26 DIAGNOSIS — K219 Gastro-esophageal reflux disease without esophagitis: Secondary | ICD-10-CM

## 2017-06-26 DIAGNOSIS — R1310 Dysphagia, unspecified: Secondary | ICD-10-CM

## 2017-06-26 MED ORDER — PANTOPRAZOLE SODIUM 40 MG PO TBEC: 40.0000 mg | DELAYED_RELEASE_TABLET | Freq: Every day | ORAL | 1 refills | Status: DC

## 2017-06-26 NOTE — Progress Notes (Signed)
Patient ID: Dennis Harvey, male   DOB: 10-02-1986, 31 y.o.   MRN: [id number] HPI: Dennis Harvey is a 31 year old male seen to evaluate globus sensation, reflux and atypical chest pain. The patient is here alone today.  He reports that he is dealing with laryngeal symptoms specifically a "lump and bubbles in his throat. This is worse with swallowing. At times he's had trouble swallowing pills and also solid foods. He reports dryness in his throat and also occasional hoarseness. Symptoms seem to be worse while he is working particularly while driving. At times he feels like his "airway is clogged". He denies dyspnea. Symptoms are worse after eating. He reports at times feeling like food never makes it all the way down and then often it will feel like it is "coming back up". No abdominal pain. Bowel movements regular. Symptoms have been present about a year or so. Has been seen in the ER multiple times for similar complaint. Also was seen by cardiology. Reports no blood in his stool and denies melena.  He tried omeprazole but this became too expensive. Nexium was hard for him to swallow. Medicines were also expensive and he lacks insurance. He tried Pepcid 20 mg daily but stopped this fairly recently due to inefficacy.  He denies a family history of GI tract malignancy. He is single with 2 children. He works as a Administrator. Past smoker but none currently. Denies alcohol. Denies illicit drug use.  Past Medical History:  Diagnosis Date  . ARF (acute renal failure) ([place]) 05/18/2016  . Environmental allergies     Past Surgical History:  Procedure Laterality Date  . NO PAST SURGERIES      Outpatient Medications Prior to Visit  Medication Sig Dispense Refill  . aspirin 81 MG chewable tablet Chew 1 tablet (81 mg total) by mouth daily. 30 tablet 0  . famotidine (PEPCID) 20 MG tablet Take 1 tablet (20 mg total) by mouth daily. 30 tablet 0  . omeprazole (PRILOSEC) 20 MG capsule Take 1 capsule (20  mg total) by mouth daily. 30 capsule 1  . sucralfate (CARAFATE) 1 GM/10ML suspension Take 10 mLs (1 g total) by mouth 4 (four) times daily -  with meals and at bedtime. 420 mL 1   No facility-administered medications prior to visit.     No Known Allergies  Family History  Problem Relation Age of Onset  . Diabetes Maternal Grandmother   . Heart failure Maternal Grandmother   . Hyperlipidemia Maternal Grandmother   . Hypertension Maternal Grandmother   . Diabetes Father   . Hypertension Father   . Deep vein thrombosis Father   . Heart block Father        has stents, non-smoker  . Colon cancer Neg Hx   . Throat cancer Neg Hx   . Esophageal cancer Neg Hx     Social History  Substance Use Topics  . Smoking status: Former Smoker    Years: 2.00    Types: Cigars  . Smokeless tobacco: Never Used     Comment: 05/21/2016 "stopped smoking cigarettes in ~ 2007"  . Alcohol use 0.6 oz/week    1 Shots of liquor per week    ROS: As per history of present illness, otherwise negative  BP 118/70   Pulse 84   Ht 5\' 7"  (1.702 m)   Wt 240 lb (108.9 kg)   BMI 37.59 kg/m  Constitutional: Well-developed and well-nourished. No distress. HEENT: Normocephalic and atraumatic. Oropharynx is clear and moist. Conjunctivae  are normal.  No scleral icterus. Neck: Neck supple. Trachea midline. Cardiovascular: Normal rate, regular rhythm and intact distal pulses. No M/R/G Pulmonary/chest: Effort normal and breath sounds normal. No wheezing, rales or rhonchi. Abdominal: Soft, nontender, nondistended. Bowel sounds active throughout. There are no masses palpable. No hepatosplenomegaly. Extremities: no clubbing, cyanosis, or edema Neurological: Alert and oriented to person place and time. Skin: Skin is warm and dry.  Psychiatric: Normal mood and affect. Behavior is normal.  RELEVANT LABS AND IMAGING: CBC    Component Value Date/Time   WBC 7.6 06/22/2017 0507   RBC 4.62 06/22/2017 0507   HGB 14.1  06/22/2017 0507   HCT 41.7 06/22/2017 0507   PLT 303 06/22/2017 0507   MCV 90.1 06/22/2017 0507   MCH 30.4 06/22/2017 0507   MCHC 33.8 06/22/2017 0507   RDW 13.3 06/22/2017 0507    CMP     Component Value Date/Time   NA 139 06/22/2017 0507   K 3.9 06/22/2017 0507   CL 107 06/22/2017 0507   CO2 27 06/22/2017 0507   GLUCOSE 107 (H) 06/22/2017 0507   BUN 14 06/22/2017 0507   CREATININE 1.21 06/22/2017 0507   CREATININE 1.39 (H) 06/01/2016 1136   CALCIUM 8.7 (L) 06/22/2017 0507   PROT 7.5 06/22/2017 0507   ALBUMIN 3.9 06/22/2017 0507   AST 20 06/22/2017 0507   ALT 19 06/22/2017 0507   ALKPHOS 53 06/22/2017 0507   BILITOT 0.7 06/22/2017 0507   GFRNONAA >60 06/22/2017 0507   GFRAA >60 06/22/2017 0507    ASSESSMENT/PLAN: 31 year old male seen to evaluate globus sensation, reflux and atypical chest pain.  1.  GERD/globus sensation/dysphagia -- symptoms most consistent with gastroesophageal reflux disease, rule out EoE. I recommended that he be more consistent with full dose PPI. We checked goodrx.com together and he can get pantoprazole affordably for about $7 a month at [facility]. Will prescribe pantoprazole 40 mg daily, 30 minutes before breakfast. Upper endoscopy recommended to evaluate dysphagia, rule out EoE and consider dilation if indicated. We discussed the risks, benefits and alternatives and he is agreeable and wishes to proceed.

## 2017-06-26 NOTE — Patient Instructions (Signed)
You have been scheduled for an endoscopy. Please follow written instructions given to you at your visit today. If you use inhalers (even only as needed), please bring them with you on the day of your procedure. Your physician has requested that you go to www.startemmi.com and enter the access code given to you at your visit today. This web site gives a general overview about your procedure. However, you should still follow specific instructions given to you by our office regarding your preparation for the procedure.  We have sent the following medications to your pharmacy for you to pick up at your convenience: Pantoprazole 40 mg once daily  If you are age 82 or older, your body mass index should be between 23-30. Your Body mass index is 37.59 kg/m. If this is out of the aforementioned range listed, please consider follow up with your Primary Care Provider.  If you are age 43 or younger, your body mass index should be between 19-25. Your Body mass index is 37.59 kg/m. If this is out of the aformentioned range listed, please consider follow up with your Primary Care Provider.

## 2017-07-12 ENCOUNTER — Encounter: Payer: Self-pay | Admitting: *Deleted

## 2017-07-17 ENCOUNTER — Encounter: Payer: Self-pay | Admitting: Internal Medicine

## 2017-07-17 ENCOUNTER — Ambulatory Visit: Payer: Self-pay | Admitting: Internal Medicine

## 2017-07-17 VITALS — BP 123/77 | HR 74 | Temp 99.5°F | Ht 67.0 in | Wt 240.0 lb

## 2017-07-17 MED ORDER — SODIUM CHLORIDE 0.9 % IV SOLN: 500.0000 mL | INTRAVENOUS | Status: DC

## 2017-07-17 NOTE — Progress Notes (Signed)
Patient reports that he ate a piece of chicken at 1150 this morning. Notified Dr. Hilarie Fredrickson and patient is to be rescheduled for his EGD. Instructions were given, nothing to eat 6 hours prior to the procedure and nothing to drink 3 hours prior. PAtient verbalized understanding. SM

## 2017-07-18 ENCOUNTER — Ambulatory Visit: Payer: Self-pay | Admitting: Gastroenterology

## 2017-07-24 ENCOUNTER — Encounter: Payer: Self-pay | Admitting: Internal Medicine

## 2017-08-08 ENCOUNTER — Encounter: Payer: Self-pay | Admitting: Internal Medicine

## 2017-08-08 ENCOUNTER — Telehealth: Payer: Self-pay | Admitting: Internal Medicine

## 2017-08-27 ENCOUNTER — Ambulatory Visit (AMBULATORY_SURGERY_CENTER): Payer: Self-pay | Admitting: Internal Medicine

## 2017-08-27 ENCOUNTER — Encounter: Payer: Self-pay | Admitting: Internal Medicine

## 2017-08-27 VITALS — BP 115/56 | HR 62 | Temp 98.4°F | Resp 11 | Ht 67.0 in | Wt 240.0 lb

## 2017-08-27 DIAGNOSIS — K219 Gastro-esophageal reflux disease without esophagitis: Secondary | ICD-10-CM

## 2017-08-27 DIAGNOSIS — K2950 Unspecified chronic gastritis without bleeding: Secondary | ICD-10-CM

## 2017-08-27 DIAGNOSIS — R1310 Dysphagia, unspecified: Secondary | ICD-10-CM

## 2017-08-27 MED ORDER — SODIUM CHLORIDE 0.9 % IV SOLN: 500.0000 mL | INTRAVENOUS | Status: DC

## 2017-08-27 NOTE — Progress Notes (Signed)
Called to room to assist during endoscopic procedure.  Patient ID and intended procedure confirmed with present staff. Received instructions for my participation in the procedure from the performing physician.  

## 2017-08-27 NOTE — Patient Instructions (Signed)
YOU HAD AN ENDOSCOPIC PROCEDURE TODAY AT [facility]:   Refer to the procedure report that was given to you for any specific questions about what was found during the examination.  If the procedure report does not answer your questions, please call your gastroenterologist to clarify.  If you requested that your care partner not be given the details of your procedure findings, then the procedure report has been included in a sealed envelope for you to review at your convenience later.  YOU SHOULD EXPECT: Some feelings of bloating in the abdomen. Passage of more gas than usual.  Walking can help get rid of the air that was put into your GI tract during the procedure and reduce the bloating. If you had a lower endoscopy (such as a colonoscopy or flexible sigmoidoscopy) you may notice spotting of blood in your stool or on the toilet paper. If you underwent a bowel prep for your procedure, you may not have a normal bowel movement for a few days.  Please Note:  You might notice some irritation and congestion in your nose or some drainage.  This is from the oxygen used during your procedure.  There is no need for concern and it should clear up in a day or so.  SYMPTOMS TO REPORT IMMEDIATELY:    Following upper endoscopy (EGD)  Vomiting of blood or coffee ground material  New chest pain or pain under the shoulder blades  Painful or persistently difficult swallowing  New shortness of breath  Fever of 100F or higher  Black, tarry-looking stools  For urgent or emergent issues, a gastroenterologist can be reached at any hour by calling [phone].   DIET:  We do recommend a small meal at first, but then you may proceed to your regular diet.  Drink plenty of fluids but you should avoid alcoholic beverages for 24 hours.  ACTIVITY:  You should plan to take it easy for the rest of today and you should NOT DRIVE or use heavy machinery until tomorrow (because of the sedation medicines used  during the test).    FOLLOW UP: Our staff will call the number listed on your records the next business day following your procedure to check on you and address any questions or concerns that you may have regarding the information given to you following your procedure. If we do not reach you, we will leave a message.  However, if you are feeling well and you are not experiencing any problems, there is no need to return our call.  We will assume that you have returned to your regular daily activities without incident.  If any biopsies were taken you will be contacted by phone or by letter within the next 1-3 weeks.  Please call us at [phone] if you have not heard about the biopsies in 3 weeks.    SIGNATURES/CONFIDENTIALITY: You and/or your care partner have signed paperwork which will be entered into your electronic medical record.  These signatures attest to the fact that that the information above on your After Visit Summary has been reviewed and is understood.  Full responsibility of the confidentiality of this discharge information lies with you and/or your care-partner.   Await biopsy results.

## 2017-08-27 NOTE — Progress Notes (Signed)
Report given to PACU, vss 

## 2017-08-27 NOTE — Op Note (Signed)
[name] Patient Name: Dennis Harvey Procedure Date: 08/27/2017 10:39 AM MRN: [id number] Endoscopist: Jerene Bears , MD Age: 31 Referring MD:  Date of Birth: April 28, 1986 Gender: Male Account #: [id number] Procedure:                Upper GI endoscopy Indications:              Dysphagia (having now improved with PPI therapy),                            Suspected gastro-esophageal reflux disease, Globus                            sensation Medicines:                Monitored Anesthesia Care Procedure:                Pre-Anesthesia Assessment:                           - Prior to the procedure, a History and Physical                            was performed, and patient medications and                            allergies were reviewed. The patient's tolerance of                            previous anesthesia was also reviewed. The risks                            and benefits of the procedure and the sedation                            options and risks were discussed with the patient.                            All questions were answered, and informed consent                            was obtained. Prior Anticoagulants: The patient has                            taken no previous anticoagulant or antiplatelet                            agents. ASA Grade Assessment: II - A patient with                            mild systemic disease. After reviewing the risks                            and benefits, the patient was deemed in  satisfactory condition to undergo the procedure.                           After obtaining informed consent, the endoscope was                            passed under direct vision. Throughout the                            procedure, the patient's blood pressure, pulse, and                            oxygen saturations were monitored continuously. The                            Endoscope was introduced through the mouth,  and                            advanced to the second part of duodenum. The upper                            GI endoscopy was accomplished without difficulty.                            The patient tolerated the procedure well. Scope In: Scope Out: Findings:                 Single island of salmon-colored mucosa was present                            at 38 cm. No other visible abnormalities were                            present. One biopsy was obtained with cold                            large-capacity forceps for histology and evaluation                            to rule out Barrett's Esophagus in the lower third                            of the esophagus.                           The Z-line was regular and was found 40 cm from the                            incisors.                           The exam of the esophagus was otherwise normal.                           The entire examined  stomach was normal. Biopsies                            were taken with a cold forceps for histology and                            Helicobacter pylori testing.                           The examined duodenum was normal. Complications:            No immediate complications. Estimated Blood Loss:     Estimated blood loss was minimal. Impression:               - Island of salmon-colored mucosa, biopsied to rule                            out Barrett's esophagus.                           - Z-line regular, 40 cm from the incisors.                           - Normal stomach. Biopsied.                           - Normal examined duodenum. Recommendation:           - Patient has a contact number available for                            emergencies. The signs and symptoms of potential                            delayed complications were discussed with the                            patient. Return to normal activities tomorrow.                            Written discharge instructions were provided to  the                            patient.                           - Resume previous diet.                           - Continue present medications.                           - Await pathology results. Jerene Bears, MD 08/27/2017 10:57:35 AM This report has been signed electronically.

## 2017-08-28 ENCOUNTER — Telehealth: Payer: Self-pay

## 2017-08-28 ENCOUNTER — Telehealth: Payer: Self-pay | Admitting: Internal Medicine

## 2017-08-28 NOTE — Telephone Encounter (Signed)
Left message

## 2017-08-28 NOTE — Telephone Encounter (Signed)
  Follow up Call-  Call back number 08/27/2017  Post procedure Call Back phone  # [phone]  Permission to leave phone message Yes  Some recent data might be hidden     Patient questions:  Do you have a fever, pain , or abdominal swelling? No. Pain Score  0 *  Have you tolerated food without any problems? Yes.    Have you been able to return to your normal activities? Yes.    Do you have any questions about your discharge instructions: Diet   No. Medications  No. Follow up visit  No.  Do you have questions or concerns about your Care? No.  Actions: * If pain score is 4 or above: No action needed, pain <4.

## 2017-08-29 ENCOUNTER — Encounter: Payer: Self-pay | Admitting: Internal Medicine

## 2017-10-31 ENCOUNTER — Emergency Department: Payer: Self-pay

## 2017-10-31 ENCOUNTER — Emergency Department
Admission: EM | Admit: 2017-10-31 | Discharge: 2017-10-31 | Disposition: A | Discharge status: Home / Self Care | Payer: Self-pay | Attending: Emergency Medicine | Admitting: Emergency Medicine

## 2017-10-31 ENCOUNTER — Other Ambulatory Visit: Payer: Self-pay

## 2017-10-31 ENCOUNTER — Encounter: Payer: Self-pay | Admitting: Emergency Medicine

## 2017-10-31 DIAGNOSIS — Z79899 Other long term (current) drug therapy: Secondary | ICD-10-CM | POA: Insufficient documentation

## 2017-10-31 DIAGNOSIS — Z87891 Personal history of nicotine dependence: Secondary | ICD-10-CM | POA: Insufficient documentation

## 2017-10-31 DIAGNOSIS — Z7982 Long term (current) use of aspirin: Secondary | ICD-10-CM | POA: Insufficient documentation

## 2017-10-31 DIAGNOSIS — R0789 Other chest pain: Secondary | ICD-10-CM | POA: Insufficient documentation

## 2017-10-31 DIAGNOSIS — R1310 Dysphagia, unspecified: Secondary | ICD-10-CM | POA: Insufficient documentation

## 2017-10-31 DIAGNOSIS — J029 Acute pharyngitis, unspecified: Secondary | ICD-10-CM | POA: Insufficient documentation

## 2017-10-31 LAB — CBC WITH DIFFERENTIAL/PLATELET
BASOS PCT: 1 %
Basophils Absolute: 0 10*3/uL (ref 0–0.1)
EOS ABS: 0.1 10*3/uL (ref 0–0.7)
Eosinophils Relative: 2 %
HEMATOCRIT: 45.4 % (ref 40.0–52.0)
HEMOGLOBIN: 14.8 g/dL (ref 13.0–18.0)
LYMPHS ABS: 2.8 10*3/uL (ref 1.0–3.6)
Lymphocytes Relative: 40 %
MCH: 29.4 pg (ref 26.0–34.0)
MCHC: 32.7 g/dL (ref 32.0–36.0)
MCV: 90 fL (ref 80.0–100.0)
MONO ABS: 0.4 10*3/uL (ref 0.2–1.0)
MONOS PCT: 6 %
NEUTROS PCT: 51 %
Neutro Abs: 3.6 10*3/uL (ref 1.4–6.5)
Platelets: 312 10*3/uL (ref 150–440)
RBC: 5.04 MIL/uL (ref 4.40–5.90)
RDW: 13.4 % (ref 11.5–14.5)
WBC: 7 10*3/uL (ref 3.8–10.6)

## 2017-10-31 LAB — COMPREHENSIVE METABOLIC PANEL
ALBUMIN: 4.3 g/dL (ref 3.5–5.0)
ALT: 29 U/L (ref 17–63)
ANION GAP: 7 (ref 5–15)
AST: 27 U/L (ref 15–41)
Alkaline Phosphatase: 58 U/L (ref 38–126)
BILIRUBIN TOTAL: 0.8 mg/dL (ref 0.3–1.2)
BUN: 22 mg/dL — AB (ref 6–20)
CALCIUM: 9.1 mg/dL (ref 8.9–10.3)
CO2: 25 mmol/L (ref 22–32)
Chloride: 106 mmol/L (ref 101–111)
Creatinine, Ser: 1.48 mg/dL — ABNORMAL HIGH (ref 0.61–1.24)
GFR calc Af Amer: >60 mL/min (ref >60)
GFR calc non Af Amer: >60 mL/min (ref >60)
GLUCOSE: 104 mg/dL — AB (ref 65–99)
Potassium: 4 mmol/L (ref 3.5–5.1)
SODIUM: 138 mmol/L (ref 135–145)
TOTAL PROTEIN: 8 g/dL (ref 6.5–8.1)

## 2017-10-31 LAB — TROPONIN I: Troponin I: <0.03 ng/mL (ref <0.03)

## 2017-10-31 MED ORDER — SUCRALFATE 1 GM/10ML PO SUSP: 1.0000 g | Freq: Four times a day (QID) | ORAL | 1 refills | Status: DC

## 2017-10-31 MED ORDER — GI COCKTAIL ~~LOC~~
30.0000 mL | Freq: Once | ORAL | Status: AC
  Administered 2017-10-31: 30 mL via ORAL
  Filled 2017-10-31: qty 30

## 2017-10-31 NOTE — Discharge Instructions (Signed)
Please return if you get hoarse have worsening pain or develop a fever or swollen glands in your neck.  Try the liquid Carafate swish and swallow and/or swish and swallow Maalox to see if that helps.  If it does not help or you are not well in a couple days please call the gastroenterologist that did your endoscopy and follow-up with him.

## 2017-10-31 NOTE — ED Triage Notes (Addendum)
Patient ambulatory to triage with steady gait, without difficulty or distress noted; pt reports difficulty swallowing last 2-3 days; st had normal endoscopy recently, taking rx acid reflux medication but cont to have diff swallowing; denies pain but st "just uncomfortable, like I have to force food down"

## 2017-10-31 NOTE — ED Provider Notes (Signed)
[facility] Emergency Department Provider Note   ____________________________________________   First MD Initiated Contact with Patient 10/31/17 0320     (approximate)  I have reviewed the triage vital signs and the nursing notes.   HISTORY  Chief Complaint Dysphagia    HPI Dennis Harvey is a 31 y.o. male who had endoscopy for dysphasia about 4 days ago.  Since then he has had increasing difficulty swallowing hurts to swallow it hurts up in his throat.  He also had a 5-10 minutes of chest pain tightness or or feeling like reflux 2 days ago.  Fever is not coughing pain is not worse with breathing pain does not seem to bother him much if he is not swallowing.   Past Medical History:  Diagnosis Date  . Allergy    SEASONAL  . ARF (acute renal failure) ([place]) 05/18/2016  . Environmental allergies   . GERD (gastroesophageal reflux disease)     Patient Active Problem List   Diagnosis Date Noted  . Atypical chest pain 03/05/2017  . Acute renal failure (ARF) ([place]) 05/20/2016  . Hyperglycemia 05/20/2016  . Appendicitis 05/18/2016    Past Surgical History:  Procedure Laterality Date  . NO PAST SURGERIES      Prior to Admission medications   Medication Sig Start Date End Date Taking? Authorizing Provider  aspirin 81 MG chewable tablet Chew 1 tablet (81 mg total) by mouth daily. 03/04/17   Clayton Bibles, PA-C  pantoprazole (PROTONIX) 40 MG tablet Take 1 tablet (40 mg total) by mouth daily. 06/26/17   Pyrtle, Lajuan Lines, MD  sucralfate (CARAFATE) 1 GM/10ML suspension Take 10 mLs (1 g total) by mouth 4 (four) times daily. Gargle with it and then swallow it. 10/31/17 10/31/18  Nena Polio, MD    Allergies Patient has no known allergies.  Family History  Problem Relation Age of Onset  . Diabetes Maternal Grandmother   . Heart failure Maternal Grandmother   . Hyperlipidemia Maternal Grandmother   . Hypertension Maternal Grandmother   . Diabetes Father     . Hypertension Father   . Deep vein thrombosis Father   . Heart block Father        has stents, non-smoker  . Heart disease Father   . Prostate cancer Father   . Colon cancer Neg Hx   . Throat cancer Neg Hx   . Esophageal cancer Neg Hx     Social History Social History   Tobacco Use  . Smoking status: Former Smoker    Years: 2.00    Types: Cigars  . Smokeless tobacco: Never Used  . Tobacco comment: 05/21/2016 "stopped smoking cigarettes in ~ 2007"  Substance Use Topics  . Alcohol use: Yes    Alcohol/week: 0.6 oz    Types: 1 Shots of liquor per week    Comment: SOCIALLY  . Drug use: No    Review of Systems  Constitutional: No fever/chills Eyes: No visual changes. JQB:HALP throat. Cardiovascular: See HPI Respiratory: Denies shortness of breath. Gastrointestinal: No abdominal pain.  No nausea, no vomiting.  No diarrhea.  No constipation. Genitourinary: Negative for dysuria. Musculoskeletal: Negative for back pain. Skin: Negative for rash. Neurological: Negative for headaches, focal weakness  ____________________________________________   PHYSICAL EXAM:  VITAL SIGNS: ED Triage Vitals [10/31/17 0306]  Enc Vitals Group     BP 133/63     Pulse Rate 68     Resp 18     Temp 98 F (36.7 C)  Temp src      SpO2 97 %     Weight 250 lb (113.4 kg)     Height 5\' 8"  (1.727 m)     Head Circumference      Peak Flow      Pain Score      Pain Loc      Pain Edu?      Excl. in [place]?     Constitutional: Alert and oriented. Well appearing and in no acute distress. Eyes: Conjunctivae are normal.  Head: Atraumatic. Nose: No congestion/rhinnorhea. Mouth/Throat: Mucous membranes are moist.  Oropharynx non-erythematous. Neck: No stridor.   Hematological/Lymphatic/Immunilogical: No cervical lymphadenopathy. Cardiovascular: Normal rate, regular rhythm.   Good peripheral circulation.  Heart regular rate and rhythm no audible murmurs Respiratory: Normal respiratory effort.   No retractions.  Lungs are clear Gastrointestinal: Soft and nontender. No distention. No abdominal bruits.  Musculoskeletal: No lower extremity tenderness nor edema.   Neurologic:  Normal speech and language. No gross focal neurologic deficits are appreciated. Skin:  Skin is warm, dry and intact. No rash noted. Psychiatric: Mood and affect are normal. Speech and behavior are normal.  ____________________________________________   LABS (all labs ordered are listed, but only abnormal results are displayed)  Labs Reviewed  COMPREHENSIVE METABOLIC PANEL - Abnormal; Notable for the following components:      Result Value   Glucose, Bld 104 (*)    BUN 22 (*)    Creatinine, Ser 1.48 (*)    All other components within normal limits  TROPONIN I  CBC WITH DIFFERENTIAL/PLATELET   ____________________________________________  EKG  EKG read and interpreted by me shows a sinus bradycardia at a rate of 52 normal axis there is some ST elevation in lead I and lead V5 but this is most likely early re-pole.  EKG looks very similar to one from August 18 of this year ____________________________________________  RADIOLOGY Chest and soft tissue the neck x-rays are read as negative ____________________________________________   PROCEDURES  Procedure(s) performed:  Procedures  Critical Care performed:   ____________________________________________   INITIAL IMPRESSION / ASSESSMENT AND PLAN / ED COURSE  Chest x-ray lab work are normal.  Soft tissue of the neck is normal too.  Patient reports some improvement with GI cocktail.  We will try the swish and swallow Carafate and/or Maalox and he will follow-up with a gastroenterologist.  I will asked him to come back if he is worse.  ____________________________________________   FINAL CLINICAL IMPRESSION(S) / ED DIAGNOSES  Final diagnoses:  Sore throat     ED Discharge Orders        Ordered    sucralfate (CARAFATE) 1 GM/10ML suspension   4 times daily     10/31/17 0545       Note:  This document was prepared using Dragon voice recognition software and may include unintentional dictation errors.    Nena Polio, MD 10/31/17 [phone]

## 2017-11-06 NOTE — Telephone Encounter (Signed)
Done

## 2017-11-09 ENCOUNTER — Other Ambulatory Visit: Payer: Self-pay

## 2017-11-09 ENCOUNTER — Emergency Department: Payer: Self-pay

## 2017-11-09 ENCOUNTER — Emergency Department
Admission: EM | Admit: 2017-11-09 | Discharge: 2017-11-09 | Disposition: A | Discharge status: Home / Self Care | Payer: Self-pay | Attending: Emergency Medicine | Admitting: Emergency Medicine

## 2017-11-09 DIAGNOSIS — Z87891 Personal history of nicotine dependence: Secondary | ICD-10-CM | POA: Insufficient documentation

## 2017-11-09 DIAGNOSIS — R1310 Dysphagia, unspecified: Secondary | ICD-10-CM | POA: Insufficient documentation

## 2017-11-09 DIAGNOSIS — Z79899 Other long term (current) drug therapy: Secondary | ICD-10-CM | POA: Insufficient documentation

## 2017-11-09 MED ORDER — GI COCKTAIL ~~LOC~~
30.0000 mL | Freq: Once | ORAL | Status: AC
  Administered 2017-11-09: 30 mL via ORAL
  Filled 2017-11-09: qty 30

## 2017-11-09 NOTE — ED Provider Notes (Signed)
[facility] Emergency Department Provider Note  ____________________________________________   First MD Initiated Contact with Patient 11/09/17 [phone]     (approximate)  I have reviewed the triage vital signs and the nursing notes.   HISTORY  Chief Complaint Dysphagia    HPI Dennis Harvey is a 32 y.o. male who comes to the emergency department with several weeks of dysphasia.  He actually has a long-standing history of the same and he was initially placed on pantoprazole which improved his symptoms.  He had an endoscopy in October of last year which was reportedly normal.  His symptoms had improved until about 2 weeks ago when his dysphagia returned.  He has no change in his voice.  He has no drooling.  No difficulty swallowing.  He was seen in our emergency department 8 days ago for the same and said he felt somewhat better after GI cocktail.  He was advised to follow-up with his gastroenterologist however he is yet to do so.  This evening he was up late playing video games when he went to the bathroom and coughed and noted a small amount of blood which concerned him so he came to the emergency department.  Past Medical History:  Diagnosis Date  . Allergy    SEASONAL  . ARF (acute renal failure) ([place]) 05/18/2016  . Environmental allergies   . GERD (gastroesophageal reflux disease)     Patient Active Problem List   Diagnosis Date Noted  . Atypical chest pain 03/05/2017  . Acute renal failure (ARF) ([place]) 05/20/2016  . Hyperglycemia 05/20/2016  . Appendicitis 05/18/2016    Past Surgical History:  Procedure Laterality Date  . NO PAST SURGERIES      Prior to Admission medications   Medication Sig Start Date End Date Taking? Authorizing Provider  aspirin 81 MG chewable tablet Chew 1 tablet (81 mg total) by mouth daily. 03/04/17   Clayton Bibles, PA-C  pantoprazole (PROTONIX) 40 MG tablet Take 1 tablet (40 mg total) by mouth daily. 06/26/17   Pyrtle, Lajuan Lines,  MD  sucralfate (CARAFATE) 1 GM/10ML suspension Take 10 mLs (1 g total) by mouth 4 (four) times daily. Gargle with it and then swallow it. 10/31/17 10/31/18  Nena Polio, MD    Allergies Patient has no known allergies.  Family History  Problem Relation Age of Onset  . Diabetes Maternal Grandmother   . Heart failure Maternal Grandmother   . Hyperlipidemia Maternal Grandmother   . Hypertension Maternal Grandmother   . Diabetes Father   . Hypertension Father   . Deep vein thrombosis Father   . Heart block Father        has stents, non-smoker  . Heart disease Father   . Prostate cancer Father   . Colon cancer Neg Hx   . Throat cancer Neg Hx   . Esophageal cancer Neg Hx     Social History Social History   Tobacco Use  . Smoking status: Former Smoker    Years: 2.00    Types: Cigars  . Smokeless tobacco: Never Used  . Tobacco comment: 05/21/2016 "stopped smoking cigarettes in ~ 2007"  Substance Use Topics  . Alcohol use: Yes    Alcohol/week: 0.6 oz    Types: 1 Shots of liquor per week    Comment: SOCIALLY  . Drug use: No    Review of Systems Constitutional: No fever/chills ENT: Positive for sore throat. Cardiovascular: Denies chest pain. Respiratory: Denies shortness of breath. Gastrointestinal: No abdominal pain.  No nausea, no vomiting.  No diarrhea.  No constipation. Musculoskeletal: Negative for back pain. Neurological: Negative for headaches   ____________________________________________   PHYSICAL EXAM:  VITAL SIGNS: ED Triage Vitals  Enc Vitals Group     BP      Pulse      Resp      Temp      Temp src      SpO2      Weight      Height      Head Circumference      Peak Flow      Pain Score      Pain Loc      Pain Edu?      Excl. in [place]?     Constitutional: Alert and oriented x4 well-appearing nontoxic no diaphoresis speaks in full clear sentences Head: Atraumatic. Nose: No congestion/rhinnorhea. Mouth/Throat: No trismus uvula midline no  pharyngeal erythema or exudate no lesions noted no drooling Neck: No stridor.   Cardiovascular: Regular rate and rhythm Respiratory: Normal respiratory effort.  No retractions. Gastrointestinal: Soft nontender Neurologic:  Normal speech and language. No gross focal neurologic deficits are appreciated.  Skin:  Skin is warm, dry and intact. No rash noted.    ____________________________________________  LABS (all labs ordered are listed, but only abnormal results are displayed)  Labs Reviewed - No data to display   __________________________________________  EKG   ____________________________________________  RADIOLOGY  X-ray of the neck is normal ____________________________________________   DIFFERENTIAL includes but not limited to  Esophageal foreign body, pharyngeal foreign body, gastritis, gastric   PROCEDURES  Procedure(s) performed: no  Procedures  Critical Care performed: no  Observation: no ____________________________________________   INITIAL IMPRESSION / ASSESSMENT AND PLAN / ED COURSE  Pertinent labs & imaging results that were available during my care of the patient were reviewed by me and considered in my medical decision making (see chart for details).  The patient is well-appearing, hemodynamically stable, protecting his airway.  He has no drooling, no stridor, no trismus.  I think it is important for the patient to follow-up with his gastroenterologist, however as he had a normal endoscopy before I think he would benefit greatly from seeing otolaryngology as well to evaluate the upper airway.  Strict return precautions have been given and the patient verbalized understanding and agreement with the plan.      ____________________________________________   FINAL CLINICAL IMPRESSION(S) / ED DIAGNOSES  Final diagnoses:  Dysphagia, unspecified type      NEW MEDICATIONS STARTED DURING THIS VISIT:  This SmartLink is deprecated. Use  AVSMEDLIST instead to display the medication list for a patient.   Note:  This document was prepared using Dragon voice recognition software and may include unintentional dictation errors.      Darel Hong, MD 11/09/17 [phone]

## 2017-11-09 NOTE — ED Triage Notes (Signed)
Patient reports continued feeling of having lump in throat.  Reports tonight he spit in the sink and noticed blood.

## 2017-11-09 NOTE — ED Notes (Signed)
Pt reports pain in his throat for "sometime". Taking Protonix for reflux. Had endoscopy.  Tonight feels worse. Pain to the right side of his throat.

## 2017-11-09 NOTE — ED Notes (Signed)
Patient transported to X-ray 

## 2017-11-09 NOTE — Discharge Instructions (Signed)
Please make sure you follow-up with your GI doctor within the next week for a recheck, however I think it is also very important that you make an appointment to follow-up with the ear nose and throat specialist for another opinion.  Return to the emergency department for any concerns such as if you cannot eat or drink, or worsening shortness of breath, for drooling, or for any other issues whatsoever.  It was a pleasure to take care of you today, and thank you for coming to our emergency department.  If you have any questions or concerns before leaving please ask the nurse to grab me and I'm more than happy to go through your aftercare instructions again.  If you were prescribed any opioid pain medication today such as Norco, Vicodin, Percocet, morphine, hydrocodone, or oxycodone please make sure you do not drive when you are taking this medication as it can alter your ability to drive safely.  If you have any concerns once you are home that you are not improving or are in fact getting worse before you can make it to your follow-up appointment, please do not hesitate to call 911 and come back for further evaluation.  Darel Hong, MD  Results for orders placed or performed during the hospital encounter of 10/31/17  Comprehensive metabolic panel  Result Value Ref Range   Sodium 138 135 - 145 mmol/L   Potassium 4.0 3.5 - 5.1 mmol/L   Chloride 106 101 - 111 mmol/L   CO2 25 22 - 32 mmol/L   Glucose, Bld 104 (H) 65 - 99 mg/dL   BUN 22 (H) 6 - 20 mg/dL   Creatinine, Ser 1.48 (H) 0.61 - 1.24 mg/dL   Calcium 9.1 8.9 - 10.3 mg/dL   Total Protein 8.0 6.5 - 8.1 g/dL   Albumin 4.3 3.5 - 5.0 g/dL   AST 27 15 - 41 U/L   ALT 29 17 - 63 U/L   Alkaline Phosphatase 58 38 - 126 U/L   Total Bilirubin 0.8 0.3 - 1.2 mg/dL   GFR calc non Af Amer >60 >60 mL/min   GFR calc Af Amer >60 >60 mL/min   Anion gap 7 5 - 15  Troponin I  Result Value Ref Range   Troponin I <0.03 <0.03 ng/mL  CBC with Differential    Result Value Ref Range   WBC 7.0 3.8 - 10.6 K/uL   RBC 5.04 4.40 - 5.90 MIL/uL   Hemoglobin 14.8 13.0 - 18.0 g/dL   HCT 45.4 40.0 - 52.0 %   MCV 90.0 80.0 - 100.0 fL   MCH 29.4 26.0 - 34.0 pg   MCHC 32.7 32.0 - 36.0 g/dL   RDW 13.4 11.5 - 14.5 %   Platelets 312 150 - 440 K/uL   Neutrophils Relative % 51 %   Neutro Abs 3.6 1.4 - 6.5 K/uL   Lymphocytes Relative 40 %   Lymphs Abs 2.8 1.0 - 3.6 K/uL   Monocytes Relative 6 %   Monocytes Absolute 0.4 0.2 - 1.0 K/uL   Eosinophils Relative 2 %   Eosinophils Absolute 0.1 0 - 0.7 K/uL   Basophils Relative 1 %   Basophils Absolute 0.0 0 - 0.1 K/uL   Dg Neck Soft Tissue  Result Date: 11/09/2017 CLINICAL DATA:  Patient reports continued feeling of having lump in throat. Reports tonight he spit in the sink and noticed blood. EXAM: NECK SOFT TISSUES - 1+ VIEW COMPARISON:  None. FINDINGS: There is no evidence of retropharyngeal  soft tissue swelling or epiglottic enlargement. The cervical airway is unremarkable and no radio-opaque foreign body identified. Wall calcifications projected inferior to the angle of mandible probably represent salivary gland stones. IMPRESSION: No acute process demonstrated in the soft tissues of the neck. Possible salivary gland stones inferior to the angle of mandible. Electronically Signed   By: Lucienne Capers M.D.   On: 11/09/2017 02:20   Dg Neck Soft Tissue  Result Date: 10/31/2017 CLINICAL DATA:  Acute onset of difficulty swallowing. EXAM: NECK SOFT TISSUES - 1+ VIEW COMPARISON:  Radiographs of the soft tissues of the neck performed 04/14/2017 FINDINGS: The nasopharynx, oropharynx and hypopharynx are unremarkable in appearance. The epiglottis is normal in thickness. The proximal trachea is unremarkable. Prevertebral soft tissues are within normal limits. No acute osseous abnormalities are seen. The visualized paranasal sinuses and mastoid air cells are well-aerated. The visualized lung apices are clear. IMPRESSION:  Unremarkable radiographs of the soft tissues of the neck. Electronically Signed   By: Garald Balding M.D.   On: 10/31/2017 04:42   Dg Chest 2 View  Result Date: 10/31/2017 CLINICAL DATA:  Acute onset of difficulty swallowing. EXAM: CHEST  2 VIEW COMPARISON:  Chest radiograph performed 06/22/2017 FINDINGS: The lungs are well-aerated and clear. There is no evidence of focal opacification, pleural effusion or pneumothorax. The heart is normal in size; the mediastinal contour is within normal limits. No acute osseous abnormalities are seen. IMPRESSION: No acute cardiopulmonary process seen. Electronically Signed   By: Garald Balding M.D.   On: 10/31/2017 04:41

## 2018-01-13 ENCOUNTER — Emergency Department (HOSPITAL_COMMUNITY)
Admission: EM | Admit: 2018-01-13 | Discharge: 2018-01-13 | Disposition: A | Discharge status: Home / Self Care | Payer: Self-pay | Attending: Emergency Medicine | Admitting: Emergency Medicine

## 2018-01-13 ENCOUNTER — Emergency Department (HOSPITAL_COMMUNITY): Payer: Self-pay

## 2018-01-13 ENCOUNTER — Encounter (HOSPITAL_COMMUNITY): Payer: Self-pay | Admitting: Emergency Medicine

## 2018-01-13 DIAGNOSIS — R0789 Other chest pain: Secondary | ICD-10-CM | POA: Insufficient documentation

## 2018-01-13 DIAGNOSIS — Z7982 Long term (current) use of aspirin: Secondary | ICD-10-CM | POA: Insufficient documentation

## 2018-01-13 DIAGNOSIS — R0602 Shortness of breath: Secondary | ICD-10-CM | POA: Insufficient documentation

## 2018-01-13 DIAGNOSIS — Z79899 Other long term (current) drug therapy: Secondary | ICD-10-CM | POA: Insufficient documentation

## 2018-01-13 DIAGNOSIS — Z87891 Personal history of nicotine dependence: Secondary | ICD-10-CM | POA: Insufficient documentation

## 2018-01-13 LAB — I-STAT TROPONIN, ED: TROPONIN I, POC: 0 ng/mL (ref 0.00–0.08)

## 2018-01-13 LAB — BASIC METABOLIC PANEL
Anion gap: 8 (ref 5–15)
BUN: 15 mg/dL (ref 6–20)
CALCIUM: 8.9 mg/dL (ref 8.9–10.3)
CO2: 24 mmol/L (ref 22–32)
CREATININE: 1.18 mg/dL (ref 0.61–1.24)
Chloride: 106 mmol/L (ref 101–111)
GFR calc Af Amer: >60 mL/min (ref >60)
GFR calc non Af Amer: >60 mL/min (ref >60)
GLUCOSE: 103 mg/dL — AB (ref 65–99)
Potassium: 4.1 mmol/L (ref 3.5–5.1)
Sodium: 138 mmol/L (ref 135–145)

## 2018-01-13 LAB — CBC
HCT: 41 % (ref 39.0–52.0)
Hemoglobin: 13.3 g/dL (ref 13.0–17.0)
MCH: 29.8 pg (ref 26.0–34.0)
MCHC: 32.4 g/dL (ref 30.0–36.0)
MCV: 91.9 fL (ref 78.0–100.0)
PLATELETS: 325 10*3/uL (ref 150–400)
RBC: 4.46 MIL/uL (ref 4.22–5.81)
RDW: 13.2 % (ref 11.5–15.5)
WBC: 8.2 10*3/uL (ref 4.0–10.5)

## 2018-01-13 MED ORDER — PANTOPRAZOLE SODIUM 40 MG PO TBEC: 40.0000 mg | DELAYED_RELEASE_TABLET | Freq: Every day | ORAL | 0 refills | Status: DC

## 2018-01-13 MED ORDER — GI COCKTAIL ~~LOC~~
30.0000 mL | Freq: Once | ORAL | Status: AC
  Administered 2018-01-13: 30 mL via ORAL
  Filled 2018-01-13: qty 30

## 2018-01-13 NOTE — ED Triage Notes (Signed)
Pt BIB [facility] for chest pain and shortness of breath that started around 2300 yesterday (01-12-18). 324 ASA given en route

## 2018-01-13 NOTE — ED Provider Notes (Signed)
[facility] EMERGENCY DEPARTMENT Provider Note   CSN: [id number] Arrival date & time: 01/13/18  0446     History   Chief Complaint Chief Complaint  Patient presents with  . Chest Pain  . Shortness of Breath    HPI Dennis Harvey is a 32 y.o. male.  32 yo M with a chief complaint of chest pain.  This occurred while patient was at rest.  The pain lasts for about 10 minutes at a time and then resolves.  Has been coming and going.  Seems to be worse when he lays back flat.  Described as a heavy pain.  He has felt some heaviness in his left arm as well.  The patient has had previous workups for the same.  He has seen GI and had an endoscopy.  He is taking Protonix but not regularly.  He denies nausea or vomiting.  Denies exertional symptoms.  Denies shortness of breath.  Denies lower extremity edema.  Denies history of PE or DVT.  Denies hypertension hyperlipidemia diabetes.  The patient is a smoker.  Smokes regularly.  Father had an MI at 72.  He denies prolonged travel.  Denies recent surgery or hospitalization.   The history is provided by the patient.  Chest Pain   This is a new problem. The current episode started yesterday. The problem occurs constantly. The problem has not changed since onset.The pain is associated with rest. The pain is present in the epigastric region. The pain is at a severity of 3/10. The pain is mild. The quality of the pain is described as brief and heavy. The pain does not radiate. Duration of episode(s) is 6 hours. The symptoms are aggravated by certain positions. Pertinent negatives include no abdominal pain, no fever, no headaches, no palpitations, no shortness of breath and no vomiting. He has tried nothing for the symptoms. The treatment provided no relief.  Pertinent negatives for past medical history include no diabetes, no DVT, no hyperlipidemia, no hypertension, no MI and no PE.  Pertinent negatives for family medical history include:  no early MI.  Shortness of Breath  Associated symptoms include chest pain. Pertinent negatives include no fever, no headaches, no vomiting, no abdominal pain and no rash. Associated medical issues do not include PE, past MI or DVT.    Past Medical History:  Diagnosis Date  . Allergy    SEASONAL  . ARF (acute renal failure) ([place]) 05/18/2016  . Environmental allergies   . GERD (gastroesophageal reflux disease)     Patient Active Problem List   Diagnosis Date Noted  . Atypical chest pain 03/05/2017  . Acute renal failure (ARF) ([place]) 05/20/2016  . Hyperglycemia 05/20/2016  . Appendicitis 05/18/2016    Past Surgical History:  Procedure Laterality Date  . NO PAST SURGERIES         Home Medications    Prior to Admission medications   Medication Sig Start Date End Date Taking? Authorizing Provider  aspirin 81 MG chewable tablet Chew 1 tablet (81 mg total) by mouth daily. 03/04/17   Clayton Bibles, PA-C  pantoprazole (PROTONIX) 40 MG tablet Take 1 tablet (40 mg total) by mouth daily. 01/13/18   Deno Etienne, DO  sucralfate (CARAFATE) 1 GM/10ML suspension Take 10 mLs (1 g total) by mouth 4 (four) times daily. Gargle with it and then swallow it. 10/31/17 10/31/18  Nena Polio, MD    Family History Family History  Problem Relation Age of Onset  . Diabetes Maternal  Grandmother   . Heart failure Maternal Grandmother   . Hyperlipidemia Maternal Grandmother   . Hypertension Maternal Grandmother   . Diabetes Father   . Hypertension Father   . Deep vein thrombosis Father   . Heart block Father        has stents, non-smoker  . Heart disease Father   . Prostate cancer Father   . Colon cancer Neg Hx   . Throat cancer Neg Hx   . Esophageal cancer Neg Hx     Social History Social History   Tobacco Use  . Smoking status: Former Smoker    Years: 2.00    Types: Cigars  . Smokeless tobacco: Never Used  . Tobacco comment: 05/21/2016 "stopped smoking cigarettes in ~ 2007"  Substance  Use Topics  . Alcohol use: Yes    Alcohol/week: 0.6 oz    Types: 1 Shots of liquor per week    Comment: SOCIALLY  . Drug use: No     Allergies   Patient has no known allergies.   Review of Systems Review of Systems  Constitutional: Negative for chills and fever.  HENT: Negative for congestion and facial swelling.   Eyes: Negative for discharge and visual disturbance.  Respiratory: Negative for shortness of breath.   Cardiovascular: Positive for chest pain. Negative for palpitations.  Gastrointestinal: Negative for abdominal pain, diarrhea and vomiting.  Musculoskeletal: Negative for arthralgias and myalgias.  Skin: Negative for color change and rash.  Neurological: Negative for tremors, syncope and headaches.  Psychiatric/Behavioral: Negative for confusion and dysphoric mood.     Physical Exam Updated Vital Signs BP 133/77 (BP Location: Right Arm)   Pulse 61   Temp 98.1 F (36.7 C) (Oral)   Resp 18   SpO2 93%   Physical Exam  Constitutional: He is oriented to person, place, and time. He appears well-developed and well-nourished.  HENT:  Head: Normocephalic and atraumatic.  Eyes: EOM are normal. Pupils are equal, round, and reactive to light.  Neck: Normal range of motion. Neck supple. No JVD present.  Cardiovascular: Normal rate and regular rhythm. Exam reveals no gallop and no friction rub.  No murmur heard. Pulmonary/Chest: No respiratory distress. He has no wheezes.  Abdominal: He exhibits no distension. There is no rebound and no guarding.  Musculoskeletal: Normal range of motion.  Neurological: He is alert and oriented to person, place, and time.  Skin: No rash noted. No pallor.  Psychiatric: He has a normal mood and affect. His behavior is normal.  Nursing note and vitals reviewed.    ED Treatments / Results  Labs (all labs ordered are listed, but only abnormal results are displayed) Labs Reviewed  BASIC METABOLIC PANEL - Abnormal; Notable for the  following components:      Result Value   Glucose, Bld 103 (*)    All other components within normal limits  CBC  I-STAT TROPONIN, ED    EKG  EKG Interpretation  Date/Time:  Monday January 13 2018 05:18:46 EDT Ventricular Rate:  83 PR Interval:  138 QRS Duration: 98 QT Interval:  386 QTC Calculation: 453 R Axis:   29 Text Interpretation:  Normal sinus rhythm with sinus arrhythmia Cannot rule out Anterior infarct , age undetermined Abnormal ECG No significant change since last tracing Confirmed by Deno Etienne [phone]) on 01/13/2018 5:45:49 AM       Radiology Dg Chest 2 View  Result Date: 01/13/2018 CLINICAL DATA:  32 y/o  M; chest pain and shortness of breath. EXAM:  CHEST - 2 VIEW COMPARISON:  10/31/2017 chest radiograph. FINDINGS: Stable heart size and mediastinal contours are within normal limits. Both lungs are clear. The visualized skeletal structures are unremarkable. IMPRESSION: No acute pulmonary process identified. Electronically Signed   By: Kristine Garbe M.D.   On: 01/13/2018 05:43    Procedures Procedures (including critical care time)  Medications Ordered in ED Medications  gi cocktail (Maalox,Lidocaine,Donnatal) (not administered)     Initial Impression / Assessment and Plan / ED Course  I have reviewed the triage vital signs and the nursing notes.  Pertinent labs & imaging results that were available during my care of the patient were reviewed by me and considered in my medical decision making (see chart for details).     32 yo M with a chief complaint of chest pain.  This is atypical in nature.  Sounds most like GI in nature.  Patient had labs chest x-ray and EKG done in triage unremarkable.  Feel this is completely atypical from ACS.  He is PERC negative. D/c home.   6:15 AM:  I have discussed the diagnosis/risks/treatment options with the patient and believe the pt to be eligible for discharge home to follow-up with PCP, GI. We also discussed  returning to the ED immediately if new or worsening sx occur. We discussed the sx which are most concerning (e.g., sudden worsening pain, fever, inability to tolerate by mouth) that necessitate immediate return. Medications administered to the patient during their visit and any new prescriptions provided to the patient are listed below.  Medications given during this visit Medications  gi cocktail (Maalox,Lidocaine,Donnatal) (not administered)     The patient appears reasonably screen and/or stabilized for discharge and I doubt any other medical condition or other [facility] requiring further screening, evaluation, or treatment in the ED at this time prior to discharge.    Final Clinical Impressions(s) / ED Diagnoses   Final diagnoses:  Atypical chest pain    ED Discharge Orders        Ordered    pantoprazole (PROTONIX) 40 MG tablet  Daily     01/13/18 0612       Deno Etienne, DO 01/13/18 5009

## 2018-01-13 NOTE — ED Notes (Signed)
Patient transported to X-ray 

## 2018-01-13 NOTE — Discharge Instructions (Signed)
Try zantac 150mg  twice a day.   Follow up with your PCP.  Follow up with your GI doc.  Try to avoid things that make reflux worse, alcohol, tobacco, spicy foods, tomato based products, fatty foods, chocolate and peppermint.

## 2018-02-19 ENCOUNTER — Emergency Department
Admission: EM | Admit: 2018-02-19 | Discharge: 2018-02-19 | Disposition: A | Discharge status: Home / Self Care | Payer: Self-pay | Attending: Emergency Medicine | Admitting: Emergency Medicine

## 2018-02-19 ENCOUNTER — Emergency Department: Payer: Self-pay

## 2018-02-19 ENCOUNTER — Other Ambulatory Visit: Payer: Self-pay

## 2018-02-19 DIAGNOSIS — Z7982 Long term (current) use of aspirin: Secondary | ICD-10-CM | POA: Insufficient documentation

## 2018-02-19 DIAGNOSIS — Z79899 Other long term (current) drug therapy: Secondary | ICD-10-CM | POA: Insufficient documentation

## 2018-02-19 DIAGNOSIS — M79604 Pain in right leg: Secondary | ICD-10-CM

## 2018-02-19 DIAGNOSIS — Z87891 Personal history of nicotine dependence: Secondary | ICD-10-CM | POA: Insufficient documentation

## 2018-02-19 DIAGNOSIS — M79661 Pain in right lower leg: Secondary | ICD-10-CM | POA: Insufficient documentation

## 2018-02-19 MED ORDER — MELOXICAM 15 MG PO TABS: 15.0000 mg | ORAL_TABLET | Freq: Every day | ORAL | 1 refills | Status: AC

## 2018-02-19 NOTE — ED Provider Notes (Signed)
[facility] Emergency Department Provider Note  ____________________________________________  Time seen: Approximately 5:04 PM  I have reviewed the triage vital signs and the nursing notes.   HISTORY  Chief Complaint Leg Pain    HPI Dennis Harvey is a 32 y.o. male presents to the emergency department with 7/10 right calf pain that is worsened with ambulation.  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  Patient smokes "occasionally".  He denies recent travel or recent surgery.  No chest pain, shortness of breath or history of DVT or PE.  Patient denies falls or recent traumas.  No alleviating measures have been attempted.  Patient reports that he is not currently taking any form of blood thinners.  Past Medical History:  Diagnosis Date  . Allergy    SEASONAL  . ARF (acute renal failure) ([place]) 05/18/2016  . Environmental allergies   . GERD (gastroesophageal reflux disease)     Patient Active Problem List   Diagnosis Date Noted  . Atypical chest pain 03/05/2017  . Acute renal failure (ARF) ([place]) 05/20/2016  . Hyperglycemia 05/20/2016  . Appendicitis 05/18/2016    Past Surgical History:  Procedure Laterality Date  . NO PAST SURGERIES      Prior to Admission medications   Medication Sig Start Date End Date Taking? Authorizing Provider  aspirin 81 MG chewable tablet Chew 1 tablet (81 mg total) by mouth daily. 03/04/17   Clayton Bibles, PA-C  meloxicam (MOBIC) 15 MG tablet Take 1 tablet (15 mg total) by mouth daily for 7 days. 02/19/18 02/26/18  Lannie Fields, PA-C  pantoprazole (PROTONIX) 40 MG tablet Take 1 tablet (40 mg total) by mouth daily. 01/13/18   Deno Etienne, DO  sucralfate (CARAFATE) 1 GM/10ML suspension Take 10 mLs (1 g total) by mouth 4 (four) times daily. Gargle with it and then swallow it. 10/31/17 10/31/18  Nena Polio, MD    Allergies Patient has no known allergies.  Family  History  Problem Relation Age of Onset  . Diabetes Maternal Grandmother   . Heart failure Maternal Grandmother   . Hyperlipidemia Maternal Grandmother   . Hypertension Maternal Grandmother   . Diabetes Father   . Hypertension Father   . Deep vein thrombosis Father   . Heart block Father        has stents, non-smoker  . Heart disease Father   . Prostate cancer Father   . Colon cancer Neg Hx   . Throat cancer Neg Hx   . Esophageal cancer Neg Hx     Social History Social History   Tobacco Use  . Smoking status: Former Smoker    Years: 2.00    Types: Cigars  . Smokeless tobacco: Never Used  . Tobacco comment: 05/21/2016 "stopped smoking cigarettes in ~ 2007"  Substance Use Topics  . Alcohol use: Yes    Alcohol/week: 0.6 oz    Types: 1 Shots of liquor per week    Comment: SOCIALLY  . Drug use: No     Review of Systems  Constitutional: No fever/chills Eyes: No visual changes. No discharge ENT: No upper respiratory complaints. Cardiovascular: no chest pain. Respiratory: no cough. No SOB. Gastrointestinal: No abdominal pain.  No nausea, no vomiting.  No diarrhea.  No constipation. Musculoskeletal: Patient has right calf pain.  Skin: Negative for rash, abrasions, lacerations, ecchymosis. Neurological: Negative for headaches, focal weakness or numbness.   ____________________________________________   PHYSICAL EXAM:  VITAL SIGNS: ED Triage Vitals  Enc Vitals Group     BP 02/19/18 1638 132/63     Pulse Rate 02/19/18 1638 93     Resp 02/19/18 1638 16     Temp 02/19/18 1638 98.5 F (36.9 C)     Temp Source 02/19/18 1638 Oral     SpO2 02/19/18 1638 96 %     Weight 02/19/18 1642 260 lb (117.9 kg)     Height 02/19/18 1642 5\' 8"  (1.727 m)     Head Circumference --      Peak Flow --      Pain Score 02/19/18 1651 4     Pain Loc --      Pain Edu? --      Excl. in [place]? --      Constitutional: Alert and oriented. Well appearing and in no acute distress. Eyes:  Conjunctivae are normal. PERRL. EOMI. Head: Atraumatic. Cardiovascular: Normal rate, regular rhythm. Normal S1 and S2.  Good peripheral circulation. Respiratory: Normal respiratory effort without tachypnea or retractions. Lungs CTAB. Good air entry to the bases with no decreased or absent breath sounds. Gastrointestinal: Bowel sounds 4 quadrants. Soft and nontender to palpation. No guarding or rigidity. No palpable masses. No distention. No CVA tenderness. Musculoskeletal: Patient has tenderness elicited with palpation of the right calf.  Mild erythema of the right calf visualized.  No edema.  Palpable dorsalis pedis pulse, right. Neurologic:  Normal speech and language. No gross focal neurologic deficits are appreciated.  Skin:  Skin is warm, dry and intact. No rash noted. Psychiatric: Mood and affect are normal. Speech and behavior are normal. Patient exhibits appropriate insight and judgement.   ____________________________________________   LABS (all labs ordered are listed, but only abnormal results are displayed)  Labs Reviewed - No data to display ____________________________________________  EKG   ____________________________________________  RADIOLOGY Unk Pinto, personally viewed and evaluated these images as part of my medical decision making, as well as reviewing the written report by the radiologist.    Dg Tibia/fibula Right  Result Date: 02/19/2018 CLINICAL DATA:  Calf pain EXAM: RIGHT TIBIA AND FIBULA - 2 VIEW COMPARISON:  None. FINDINGS: Negative for acute fracture or bone lesion. Small chronic fracture of the medial malleolus. IMPRESSION: No acute abnormality. Electronically Signed   By: Franchot Gallo M.D.   On: 02/19/2018 18:34   US Venous Img Lower Unilateral Right  Result Date: 02/19/2018 CLINICAL DATA:  RIGHT calf pain for 1 day EXAM: RIGHT LOWER EXTREMITY VENOUS DOPPLER ULTRASOUND TECHNIQUE: Gray-scale sonography with graded compression, as well as  color Doppler and duplex ultrasound were performed to evaluate the lower extremity deep venous systems from the level of the common femoral vein and including the common femoral, femoral, profunda femoral, popliteal and calf veins including the posterior tibial, peroneal and gastrocnemius veins when visible. The superficial great saphenous vein was also interrogated. Spectral Doppler was utilized to evaluate flow at rest and with distal augmentation maneuvers in the common femoral, femoral and popliteal veins. COMPARISON:  None FINDINGS: Contralateral Common Femoral Vein: Respiratory phasicity is normal and symmetric with the symptomatic side. No evidence of thrombus. Normal compressibility. Common Femoral Vein: No evidence of thrombus. Normal compressibility, respiratory phasicity and response to augmentation. Saphenofemoral Junction: No evidence of thrombus. Normal compressibility and flow on color Doppler imaging. Profunda Femoral Vein: No evidence of thrombus. Normal compressibility and flow on color Doppler imaging. Femoral Vein: No evidence of thrombus. Normal compressibility, respiratory phasicity and response to  augmentation. Popliteal Vein: No evidence of thrombus. Normal compressibility, respiratory phasicity and response to augmentation. Calf Veins: No evidence of thrombus. Normal compressibility and flow on color Doppler imaging. Superficial Great Saphenous Vein: No evidence of thrombus. Normal compressibility. Venous Reflux:  None. Other Findings:  None. IMPRESSION: No evidence of deep venous thrombosis in the RIGHT lower extremity. Electronically Signed   By: Lavonia Dana M.D.   On: 02/19/2018 17:33    ____________________________________________    PROCEDURES  Procedure(s) performed:    Procedures    Medications - No data to display   ____________________________________________   INITIAL IMPRESSION / ASSESSMENT AND PLAN / ED COURSE  Pertinent labs & imaging results that were  available during my care of the patient were reviewed by me and considered in my medical decision making (see chart for details).  Review of the  [place]RS was performed in accordance of the [place] prior to dispensing any controlled drugs.     Assessment and plan Calf pain Patient presents to the emergency department with right calf pain.  Differential diagnosis included DVT and gastrocnemius strain.  Venous ultrasound and right tibia-fibula x-rays revealed no acute fracture or bony abnormality.  Patient was discharged with meloxicam and advised to follow-up with primary care as needed.  All patient questions were answered.   ____________________________________________  FINAL CLINICAL IMPRESSION(S) / ED DIAGNOSES  Final diagnoses:  Right leg pain      NEW MEDICATIONS STARTED DURING THIS VISIT:  ED Discharge Orders        Ordered    meloxicam (MOBIC) 15 MG tablet  Daily     02/19/18 1849          This chart was dictated using voice recognition software/Dragon. Despite best efforts to proofread, errors can occur which can change the meaning. Any change was purely unintentional.    Lannie Fields, PA-C 02/19/18 2053    Schuyler Amor, MD 02/20/18 [phone]

## 2018-02-19 NOTE — ED Notes (Signed)
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

## 2018-02-19 NOTE — ED Triage Notes (Signed)
Pt c/o right calf pain that started today. 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 Worse with movement.Dennis Harvey

## 2018-11-08 ENCOUNTER — Emergency Department: Payer: Self-pay

## 2018-11-08 ENCOUNTER — Other Ambulatory Visit: Payer: Self-pay

## 2018-11-08 DIAGNOSIS — R51 Headache: Secondary | ICD-10-CM | POA: Insufficient documentation

## 2018-11-08 DIAGNOSIS — R0789 Other chest pain: Secondary | ICD-10-CM | POA: Insufficient documentation

## 2018-11-08 DIAGNOSIS — Z5321 Procedure and treatment not carried out due to patient leaving prior to being seen by health care provider: Secondary | ICD-10-CM | POA: Insufficient documentation

## 2018-11-08 DIAGNOSIS — R110 Nausea: Secondary | ICD-10-CM | POA: Insufficient documentation

## 2018-11-08 LAB — CBC
HEMATOCRIT: 43.5 % (ref 39.0–52.0)
Hemoglobin: 13.8 g/dL (ref 13.0–17.0)
MCH: 29.4 pg (ref 26.0–34.0)
MCHC: 31.7 g/dL (ref 30.0–36.0)
MCV: 92.6 fL (ref 80.0–100.0)
PLATELETS: 325 10*3/uL (ref 150–400)
RBC: 4.7 MIL/uL (ref 4.22–5.81)
RDW: 12.6 % (ref 11.5–15.5)
WBC: 7.7 10*3/uL (ref 4.0–10.5)
nRBC: 0 % (ref 0.0–0.2)

## 2018-11-08 LAB — BASIC METABOLIC PANEL
Anion gap: 5 (ref 5–15)
BUN: 15 mg/dL (ref 6–20)
CHLORIDE: 106 mmol/L (ref 98–111)
CO2: 23 mmol/L (ref 22–32)
CREATININE: 1.14 mg/dL (ref 0.61–1.24)
Calcium: 8.9 mg/dL (ref 8.9–10.3)
GFR calc non Af Amer: >60 mL/min (ref >60)
Glucose, Bld: 112 mg/dL — ABNORMAL HIGH (ref 70–99)
POTASSIUM: 3.5 mmol/L (ref 3.5–5.1)
Sodium: 134 mmol/L — ABNORMAL LOW (ref 135–145)

## 2018-11-08 LAB — TROPONIN I

## 2018-11-08 NOTE — ED Triage Notes (Signed)
Patient reports headache with nausea and tightness across chest starting about an hour prior to arrival.

## 2018-11-09 ENCOUNTER — Emergency Department
Admission: EM | Admit: 2018-11-09 | Discharge: 2018-11-09 | Disposition: A | Discharge status: Home / Self Care | Payer: Self-pay | Attending: Emergency Medicine | Admitting: Emergency Medicine

## 2018-11-09 NOTE — ED Notes (Signed)
Patient up to stat desk inquiring about wait time, patient given update, verbalized understanding.

## 2018-11-10 ENCOUNTER — Telehealth: Payer: Self-pay | Admitting: Emergency Medicine

## 2018-11-10 NOTE — Telephone Encounter (Signed)
Called patient due to lwot to inquire about condition and follow up plans. Left message.   

## 2019-03-13 IMAGING — DX DG CHEST 2V
2 series · 2 of 2 positions shown · non-contrast
Comparison: 11/22/2016

CLINICAL DATA: Intermittent left-sided chest pain for several
months.

EXAM:
CHEST  2 VIEW

[w chest pa]
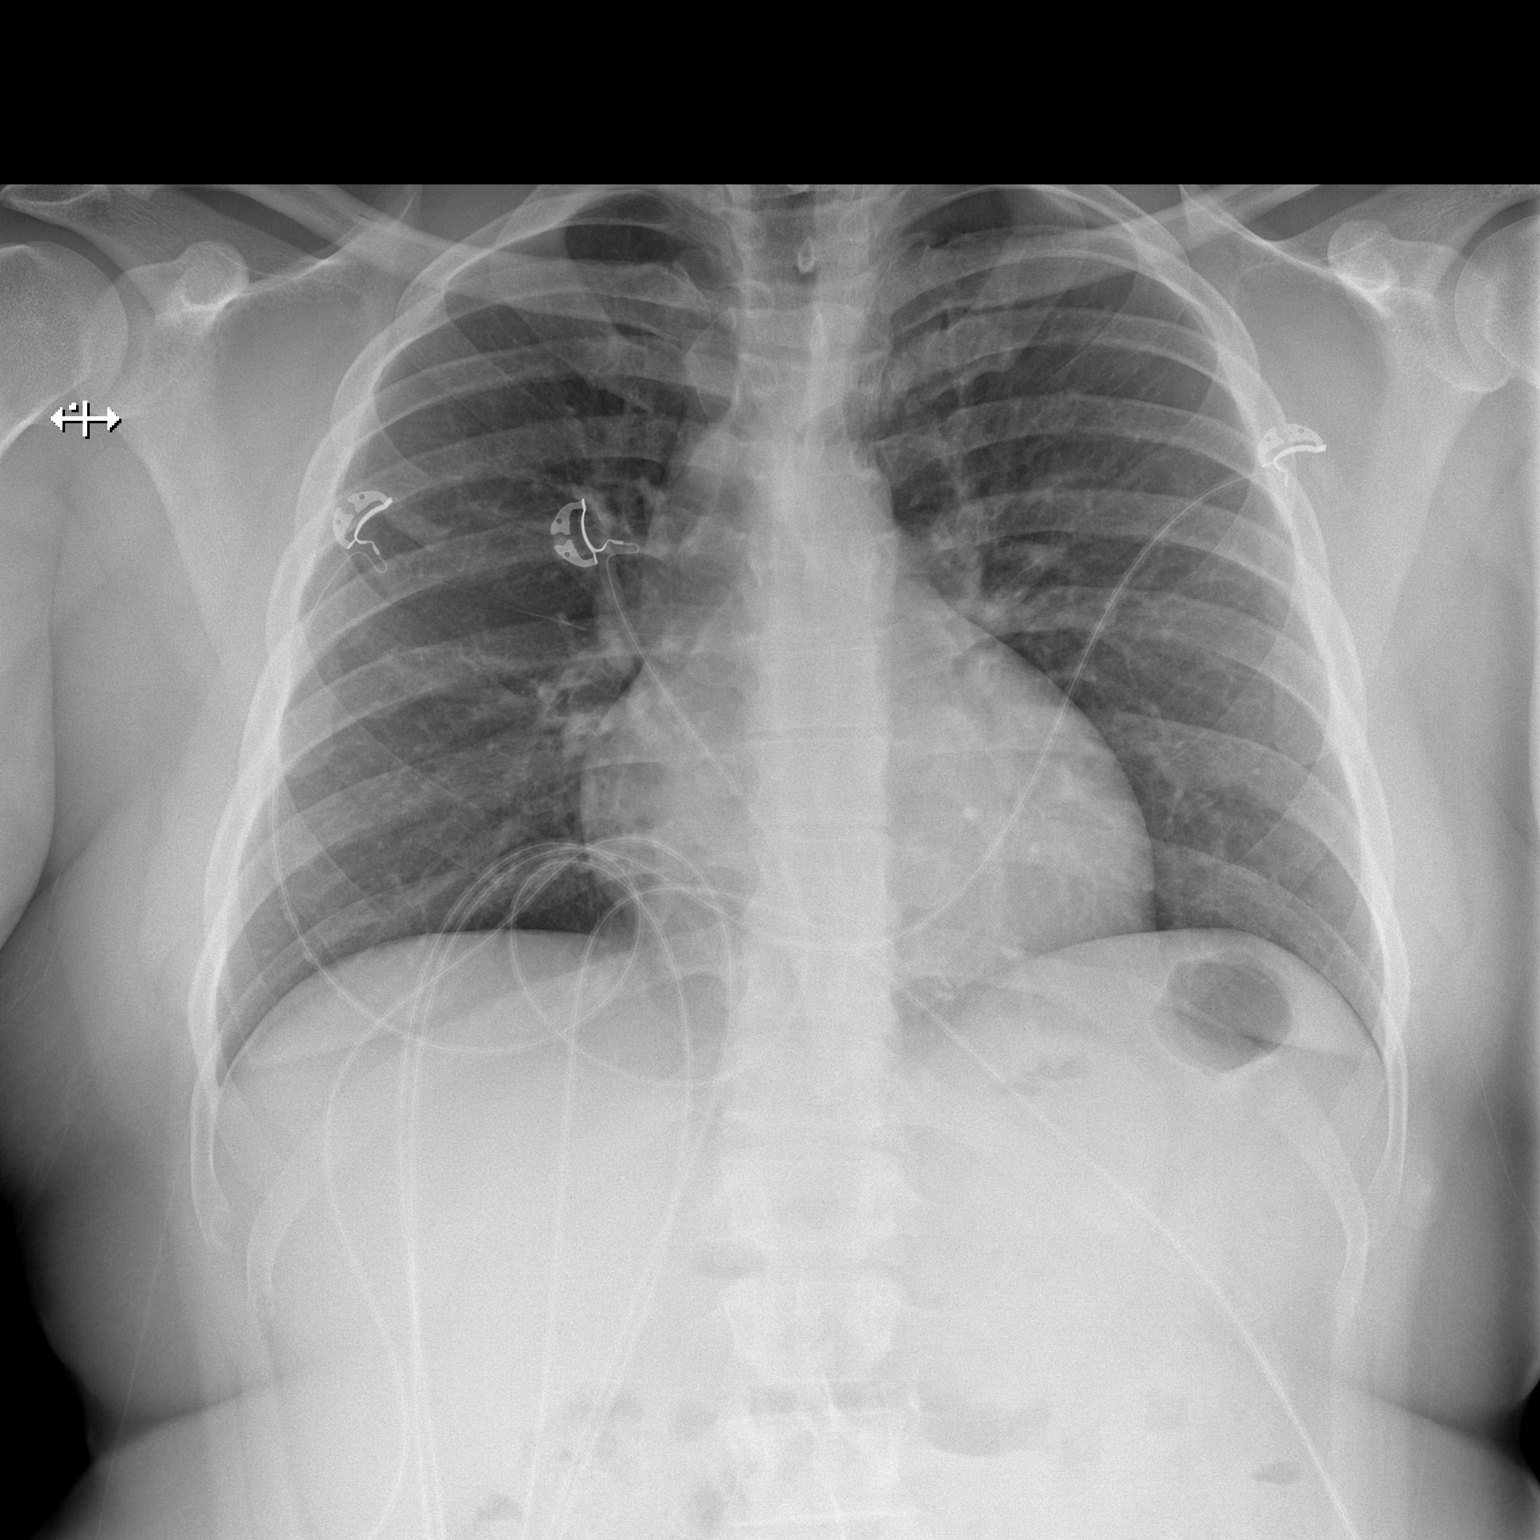

[w chest lat]
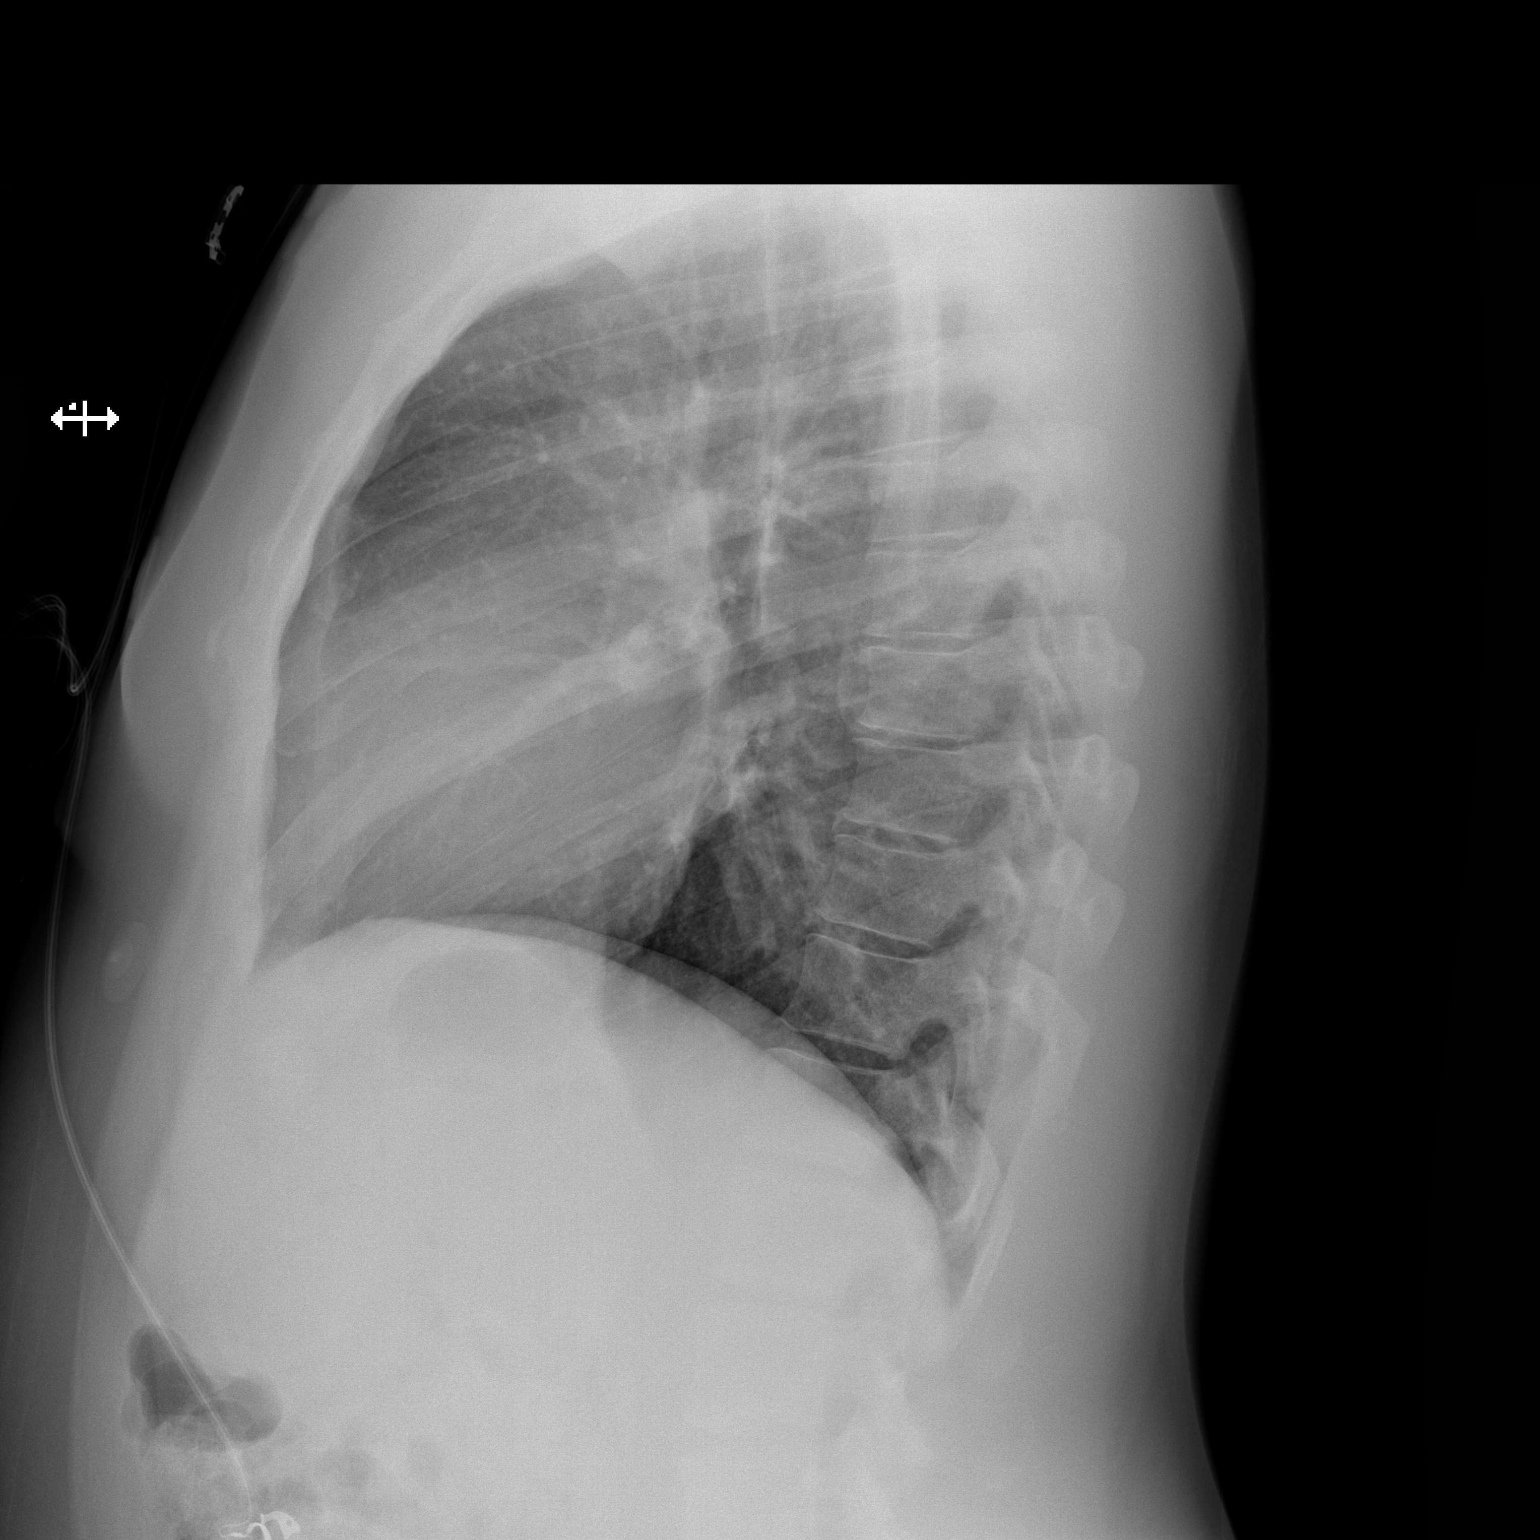

[2 of 2 positions shown; findings below may reference images not displayed]

FINDINGS: The lungs are clear. The pulmonary vasculature is normal. Heart size
is normal. Hilar and mediastinal contours are unremarkable. There is
no pleural effusion.
IMPRESSION: No active cardiopulmonary disease.

## 2019-10-19 ENCOUNTER — Other Ambulatory Visit: Payer: Self-pay

## 2019-10-19 DIAGNOSIS — Z20822 Contact with and (suspected) exposure to covid-19: Secondary | ICD-10-CM

## 2019-10-20 LAB — NOVEL CORONAVIRUS, NAA: SARS-CoV-2, NAA: NOT DETECTED

## 2019-10-22 ENCOUNTER — Ambulatory Visit: Payer: HRSA Program | Attending: Internal Medicine

## 2019-10-22 ENCOUNTER — Other Ambulatory Visit: Payer: Self-pay

## 2019-10-22 DIAGNOSIS — Z20828 Contact with and (suspected) exposure to other viral communicable diseases: Secondary | ICD-10-CM | POA: Insufficient documentation

## 2019-10-22 DIAGNOSIS — Z20822 Contact with and (suspected) exposure to covid-19: Secondary | ICD-10-CM

## 2019-10-23 LAB — NOVEL CORONAVIRUS, NAA: SARS-CoV-2, NAA: NOT DETECTED

## 2019-10-23 NOTE — Progress Notes (Signed)
Order(s) created erroneously. Erroneous order ID: [id number]  Order moved by: Brigitte Pulse  Order move date/time: 10/23/2019 2:28 PM  Source Patient: [id number]  Source Contact: 10/22/2019  Destination Patient: [id number]  Destination Contact: 10/22/2019

## 2019-10-23 NOTE — Progress Notes (Signed)
Orders moved to this encounter.

## 2019-10-26 ENCOUNTER — Emergency Department (HOSPITAL_COMMUNITY)
Admission: EM | Admit: 2019-10-26 | Discharge: 2019-10-26 | Disposition: A | Discharge status: Home / Self Care | Payer: Self-pay | Attending: Emergency Medicine | Admitting: Emergency Medicine

## 2019-10-26 ENCOUNTER — Other Ambulatory Visit: Payer: Self-pay

## 2019-10-26 ENCOUNTER — Encounter (HOSPITAL_COMMUNITY): Payer: Self-pay | Admitting: Emergency Medicine

## 2019-10-26 ENCOUNTER — Emergency Department (HOSPITAL_COMMUNITY): Payer: Self-pay

## 2019-10-26 DIAGNOSIS — M25572 Pain in left ankle and joints of left foot: Secondary | ICD-10-CM | POA: Insufficient documentation

## 2019-10-26 DIAGNOSIS — Z7982 Long term (current) use of aspirin: Secondary | ICD-10-CM | POA: Insufficient documentation

## 2019-10-26 DIAGNOSIS — Z87891 Personal history of nicotine dependence: Secondary | ICD-10-CM | POA: Insufficient documentation

## 2019-10-26 DIAGNOSIS — Z79899 Other long term (current) drug therapy: Secondary | ICD-10-CM | POA: Insufficient documentation

## 2019-10-26 MED ORDER — IBUPROFEN 800 MG PO TABS: 800.0000 mg | ORAL_TABLET | Freq: Three times a day (TID) | ORAL | 0 refills | Status: DC | PRN

## 2019-10-26 MED ORDER — IBUPROFEN 200 MG PO TABS
600.0000 mg | ORAL_TABLET | Freq: Once | ORAL | Status: AC
  Administered 2019-10-26: 600 mg via ORAL
  Filled 2019-10-26: qty 3

## 2019-10-26 NOTE — Discharge Instructions (Signed)
You may alternate Tylenol 1000 mg every 6 hours as needed for pain, fever and Ibuprofen 800 mg every 8 hours as needed for pain, fever.  Please take Ibuprofen with food.  Do not take more than 4000 mg of Tylenol (acetaminophen) in a 24 hour period.  Your x-ray today is normal.  Your symptoms may be secondary to tendinitis from overuse.  I recommend rest, elevation, ice and anti-inflammatory such as ibuprofen.  I do not think that you have a blood clot known as a DVT but if you develop calf tenderness or calf swelling, please return to the emergency department.  I recommend follow-up with sports medicine or PCP if symptoms or not improving.   Steps to find a Primary Care Provider (PCP):  Call [phone] or [phone] to access "[facility]."  2.  You may also go on the [facility] website at [email or web address]  3.  [facility] also frequently accepts new patients.  [place] [phone]  4.  There are also multiple [facility], [facility] and [facility] practices throughout the [place] that are frequently accepting new patients. You may find a clinic that is close to your home and contact them.  [facility] [email or web address] [phone]  [facility] [email or web address]  [email or web address] [place] [email or web address] [phone]  5.  Local Health Departments also can provide primary care services.  [facility]  [place] [address] [phone]  [facility] [place] [address] [place] [facility] [place] [phone]

## 2019-10-26 NOTE — ED Provider Notes (Signed)
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  No known injury.  No calf pain or calf swelling.  No redness or warmth.  No numbness.  Able to ambulate.  Taking ibuprofen 200 mg intermittently with relief.  ROS: See HPI Constitutional: no fever  Eyes: no drainage  ENT: no runny nose   Cardiovascular:  no chest pain  Resp: no SOB  GI: no vomiting GU: no dysuria Integumentary: no rash  Allergy: no hives  Musculoskeletal: no leg swelling  Neurological: no slurred speech ROS otherwise negative  PAST MEDICAL HISTORY/PAST SURGICAL HISTORY:  Past Medical History:  Diagnosis Date  . Allergy    SEASONAL  . ARF (acute renal failure) ([place]) 05/18/2016  . Environmental allergies   . GERD (gastroesophageal reflux disease)     MEDICATIONS:  Prior to Admission medications   Medication Sig Start Date End Date Taking? Authorizing Provider  aspirin 81 MG chewable tablet Chew 1 tablet (81 mg total) by mouth daily. 03/04/17   Clayton Bibles, PA-C  pantoprazole (PROTONIX) 40 MG tablet Take 1 tablet (40 mg total) by mouth daily. 01/13/18   Deno Etienne, DO  sucralfate (CARAFATE) 1 GM/10ML suspension Take 10 mLs (1 g total) by mouth 4 (four) times daily. Gargle with it and then swallow it. 10/31/17 10/31/18  Nena Polio, MD    ALLERGIES:  No Known Allergies  SOCIAL HISTORY:  Social History   Tobacco Use  . Smoking status: Former Smoker    Years: 2.00    Types: Cigars  . Smokeless tobacco: Never Used  . Tobacco comment: 05/21/2016 "stopped smoking cigarettes in ~ 2007"  Substance Use Topics  . Alcohol use: Yes    Alcohol/week: 1.0 standard drinks    Types: 1 Shots of liquor per week    Comment: SOCIALLY    FAMILY HISTORY: Family History  Problem Relation Age of Onset  . Diabetes Maternal Grandmother   . Heart failure Maternal Grandmother   .  Hyperlipidemia Maternal Grandmother   . Hypertension Maternal Grandmother   . Diabetes Father   . Hypertension Father   . Deep vein thrombosis Father   . Heart block Father        has stents, non-smoker  . Heart disease Father   . Prostate cancer Father   . Colon cancer Neg Hx   . Throat cancer Neg Hx   . Esophageal cancer Neg Hx     EXAM: BP 138/78 (BP Location: Right Arm)   Pulse 80   Temp 98.1 F (36.7 C) (Oral)   Resp 15   Ht 5\' 8"  (1.727 m)   Wt 117.4 kg   SpO2 98%   BMI 39.35 kg/m  CONSTITUTIONAL: Alert and responds appropriately to questions. Well-appearing; well-nourished HEAD: Normocephalic, atraumatic EYES: Conjunctivae clear, pupils appear equal ENT: normal nose; moist mucous membranes NECK: Normal range of motion CARD: Regular rate and rhythm RESP: Normal chest excursion without splinting or tachypnea; no hypoxia or respiratory distress, speaking full sentences ABD/GI: non-distended EXT: Normal ROM in all joints, no major deformities noted, tender over the left medial ankle with some soft tissue swelling without redness, warmth, ecchymosis or deformity.  2+ left DP pulse on exam.  No calf tenderness or calf swelling of the left lower extremity. SKIN: Normal color for age and race, no rashes on exposed skin NEURO: Moves all  extremities equally, normal speech, no facial asymmetry noted PSYCH: The patient's mood and manner are appropriate. Grooming and personal hygiene are appropriate.  MEDICAL DECISION MAKING: Patient here with left-sided ankle pain.  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  I suspect that he has tendinitis from overuse.  Will obtain x-ray to rule out occult fracture.  No sign of DVT, septic arthritis, compartment syndrome, gout, cellulitis on exam.  Took ibuprofen 200 mg 4 hours ago.  Will give another 600 mg dose here.  He refuses IM medications at this time.  I do not feel he needs a venous Doppler and we have discussed  this.  He has no history of DVT.  He is a Administrator with some episodes of prolonged immobilization but has no calf tenderness or calf swelling and his tenderness is located to the medial ankle.  ED PROGRESS: X-ray shows no acute abnormality.  Will discharge home.  Will give outpatient sports medicine follow-up.  Will provide with prescription for ibuprofen.  Discussed rest, elevation, ice.  He has an ankle splint for comfort.  At this time, I do not feel there is any life-threatening condition present. I have reviewed, interpreted and discussed all results (EKG, imaging, lab, urine as appropriate) and exam findings with patient/family. I have reviewed nursing notes and appropriate previous records.  I feel the patient is safe to be discharged home without further emergent workup and can continue workup as an outpatient as needed. Discussed usual and customary return precautions. Patient/family verbalize understanding and are comfortable with this plan.  Outpatient follow-up has been provided as needed. All questions have been answered.   Jayne Gerrits was evaluated in Emergency Department on 10/26/2019 for the symptoms described in the history of present illness. He was evaluated in the context of the global COVID-19 pandemic, which necessitated consideration that the patient might be at risk for infection with the SARS-CoV-2 virus that causes COVID-19. Institutional protocols and algorithms that pertain to the evaluation of patients at risk for COVID-19 are in a state of rapid change based on information released by regulatory bodies including the CDC and federal and state organizations. These policies and algorithms were followed during the patient's care in the ED.   Patient was seen wearing N95, face shield, gloves.     Brek Reece, Delice Bison, DO 10/26/19 [phone]

## 2019-10-26 NOTE — ED Triage Notes (Signed)
Pt reports pain in left foot for the last week and denies any trauma. Pt reports taking tylenol and ibuprofen. Pt reports most pain on medial ankle. Pt states pain radiates into heel of foot.

## 2020-01-23 IMAGING — CR DG CHEST 2V
2 series · 2 of 2 positions shown · non-contrast
Comparison: 10/31/2017 chest radiograph.

CLINICAL DATA: 31 y/o  M; chest pain and shortness of breath.

EXAM:
CHEST - 2 VIEW

[chest pa]
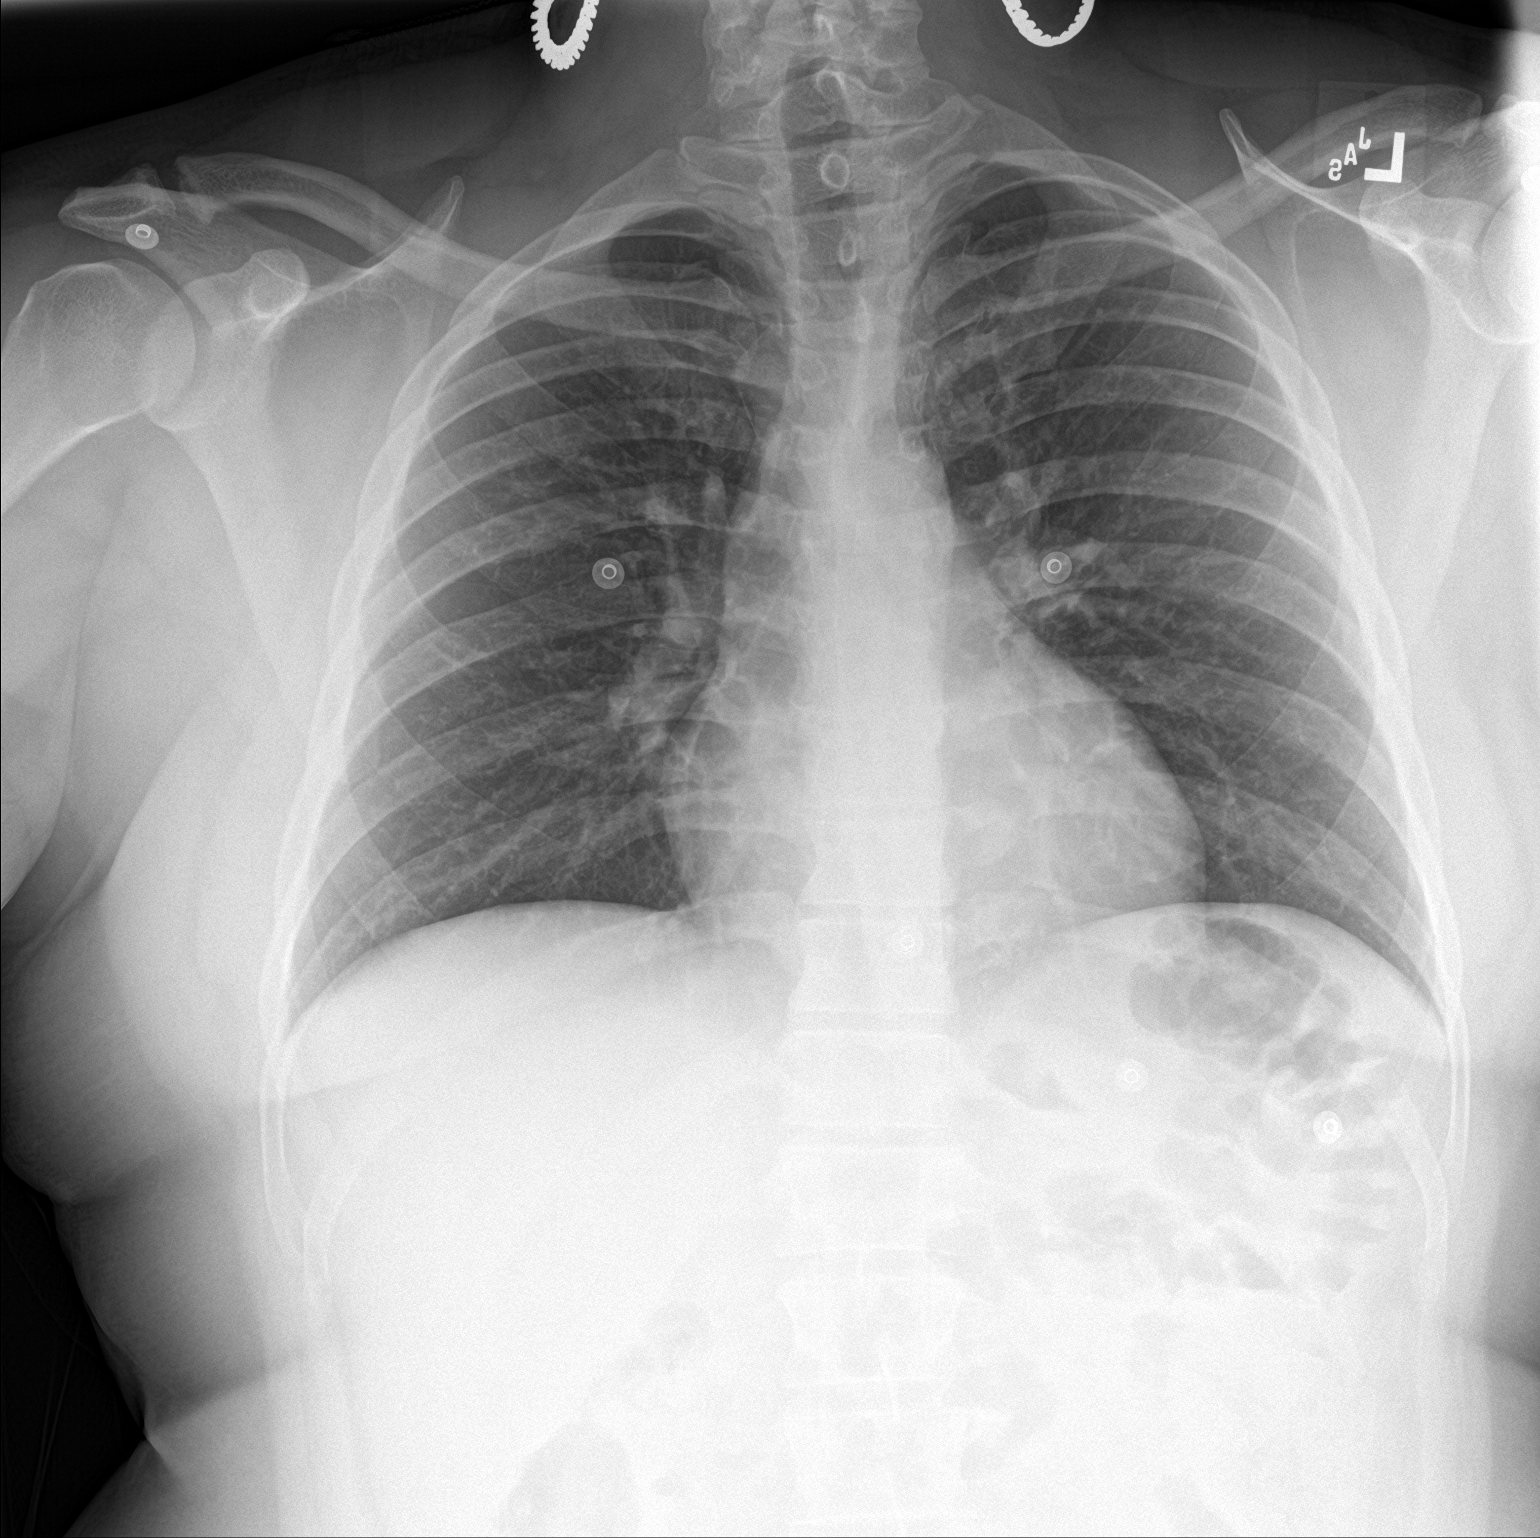

[chest lat]
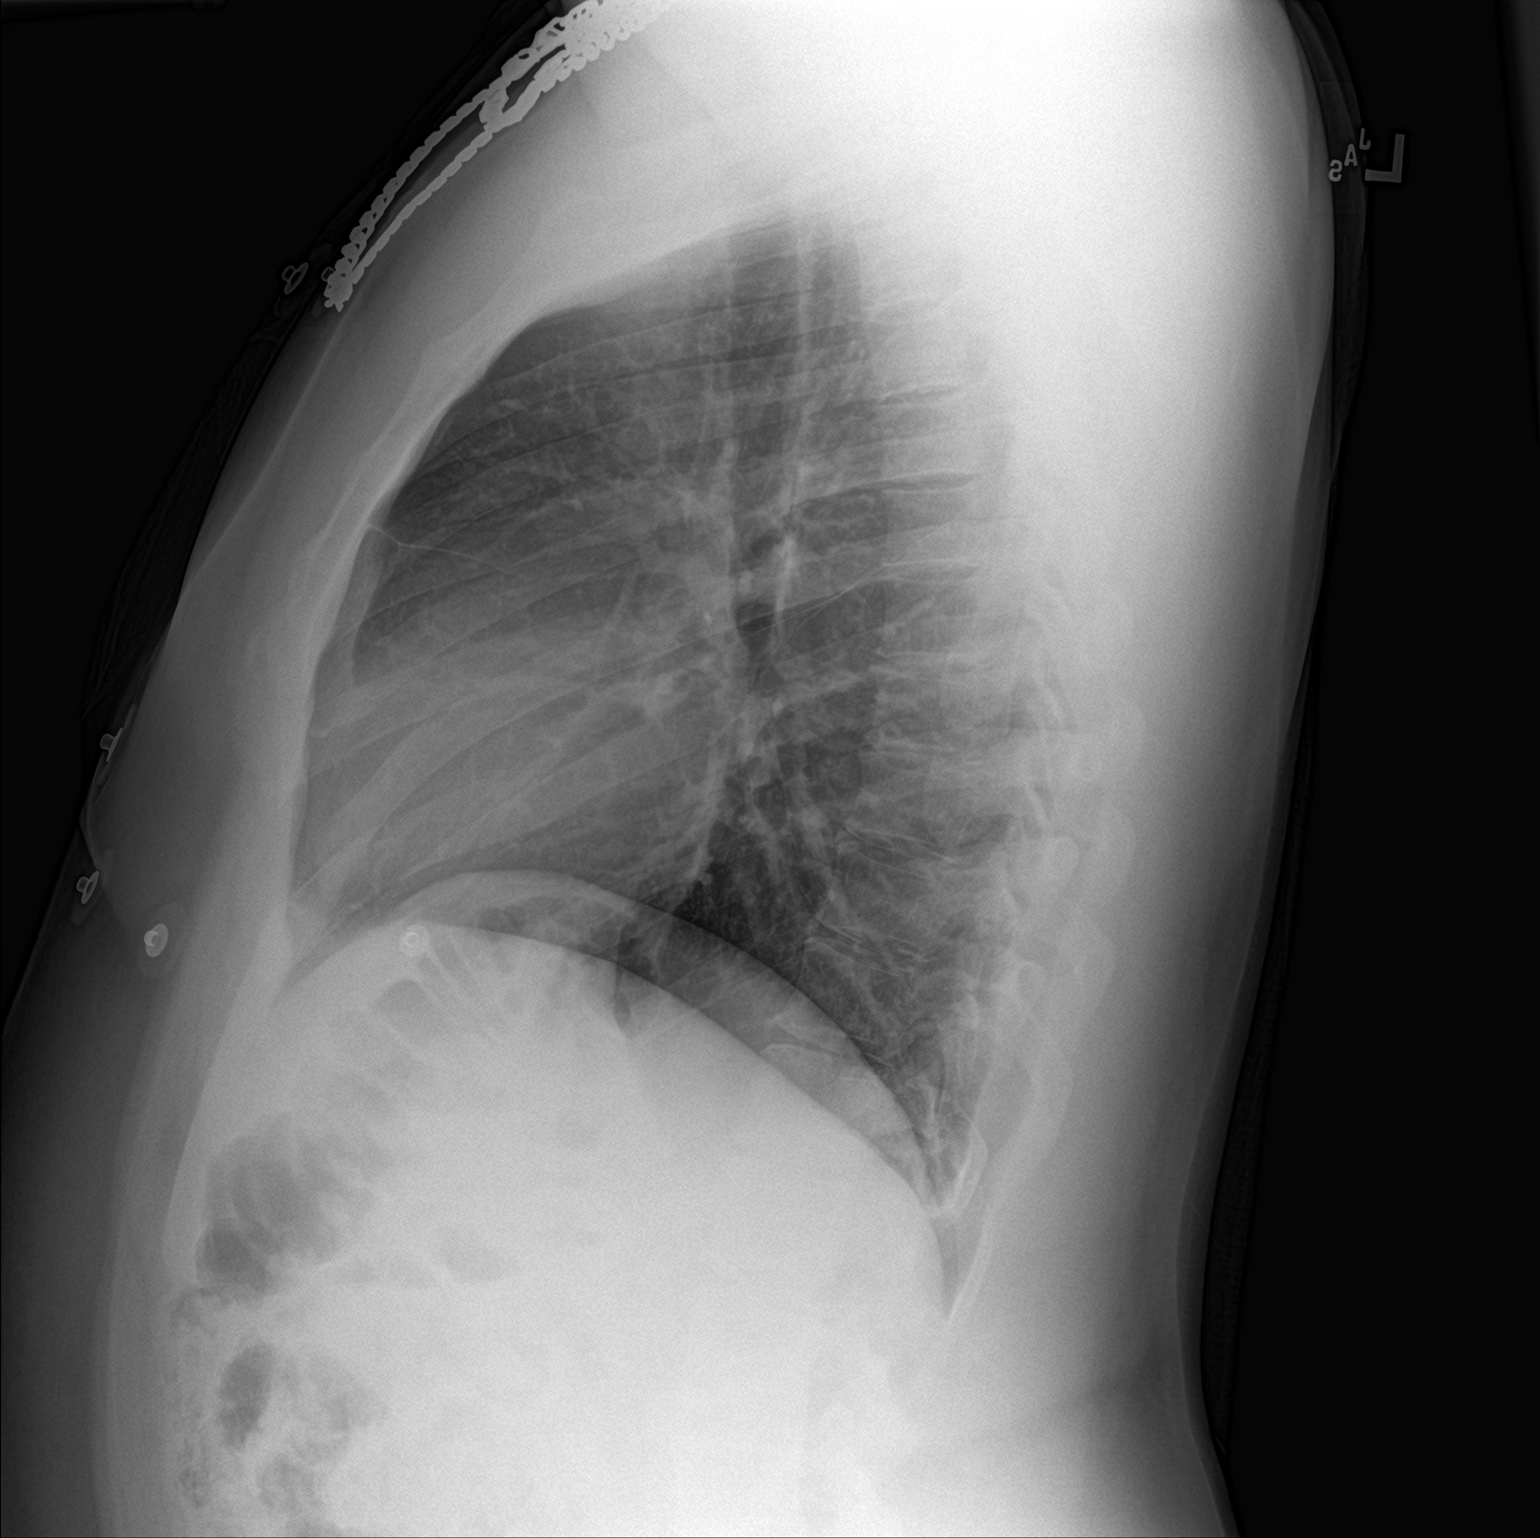

[2 of 2 positions shown; findings below may reference images not displayed]

FINDINGS: Stable heart size and mediastinal contours are within normal limits.
Both lungs are clear. The visualized skeletal structures are
unremarkable.
IMPRESSION: No acute pulmonary process identified.

By: Dai Va Seibokiene M.D.

## 2020-02-24 ENCOUNTER — Emergency Department
Admission: EM | Admit: 2020-02-24 | Discharge: 2020-02-24 | Disposition: A | Discharge status: Home / Self Care | Payer: Self-pay | Attending: Emergency Medicine | Admitting: Emergency Medicine

## 2020-02-24 ENCOUNTER — Emergency Department: Payer: Self-pay

## 2020-02-24 ENCOUNTER — Other Ambulatory Visit: Payer: Self-pay

## 2020-02-24 DIAGNOSIS — F17290 Nicotine dependence, other tobacco product, uncomplicated: Secondary | ICD-10-CM | POA: Insufficient documentation

## 2020-02-24 DIAGNOSIS — M25532 Pain in left wrist: Secondary | ICD-10-CM | POA: Insufficient documentation

## 2020-02-24 DIAGNOSIS — Z7982 Long term (current) use of aspirin: Secondary | ICD-10-CM | POA: Insufficient documentation

## 2020-02-24 MED ORDER — MELOXICAM 15 MG PO TABS: 15.0000 mg | ORAL_TABLET | Freq: Every day | ORAL | 0 refills | Status: DC

## 2020-02-24 NOTE — ED Provider Notes (Signed)
[facility] Emergency Department Provider Note  ____________________________________________   First MD Initiated Contact with Patient 02/24/20 [phone]     (approximate)  I have reviewed the triage vital signs and the nursing notes.   HISTORY  Chief Complaint Wrist Pain   HPI Dennis Harvey is a 34 y.o. male presents to the ED with complaint of left wrist pain for several days.  Patient states that this morning he noticed that there was some swelling.  He is unaware of any injury.  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  He is unaware of any childhood injuries.  He has not been take any over-the-counter medication.      Past Medical History:  Diagnosis Date  . Allergy    SEASONAL  . ARF (acute renal failure) ([place]) 05/18/2016  . Environmental allergies   . GERD (gastroesophageal reflux disease)     Patient Active Problem List   Diagnosis Date Noted  . Atypical chest pain 03/05/2017  . Acute renal failure (ARF) ([place]) 05/20/2016  . Hyperglycemia 05/20/2016  . Appendicitis 05/18/2016    Past Surgical History:  Procedure Laterality Date  . NO PAST SURGERIES      Prior to Admission medications   Medication Sig Start Date End Date Taking? Authorizing Provider  aspirin 81 MG chewable tablet Chew 1 tablet (81 mg total) by mouth daily. 03/04/17   Clayton Bibles, PA-C  ibuprofen (ADVIL) 800 MG tablet Take 1 tablet (800 mg total) by mouth every 8 (eight) hours as needed for mild pain. 10/26/19   Ward, Delice Bison, DO  meloxicam (MOBIC) 15 MG tablet Take 1 tablet (15 mg total) by mouth daily. 02/24/20 02/23/21  Johnn Hai, PA-C  pantoprazole (PROTONIX) 40 MG tablet Take 1 tablet (40 mg total) by mouth daily. 01/13/18   Deno Etienne, DO  sucralfate (CARAFATE) 1 GM/10ML suspension Take 10 mLs (1 g total) by mouth 4 (four) times daily. Gargle with it and then swallow it. 10/31/17 10/31/18   Nena Polio, MD    Allergies Patient has no known allergies.  Family History  Problem Relation Age of Onset  . Diabetes Maternal Grandmother   . Heart failure Maternal Grandmother   . Hyperlipidemia Maternal Grandmother   . Hypertension Maternal Grandmother   . Diabetes Father   . Hypertension Father   . Deep vein thrombosis Father   . Heart block Father        has stents, non-smoker  . Heart disease Father   . Prostate cancer Father   . Colon cancer Neg Hx   . Throat cancer Neg Hx   . Esophageal cancer Neg Hx     Social History Social History   Tobacco Use  . Smoking status: Light Tobacco Smoker    Years: 2.00    Types: Cigars  . Smokeless tobacco: Never Used  . Tobacco comment: 05/21/2016 "stopped smoking cigarettes in ~ 2007"  Substance Use Topics  . Alcohol use: Yes    Alcohol/week: 1.0 standard drinks    Types: 1 Shots of liquor per week    Comment: SOCIALLY  . Drug use: No    Review of Systems Constitutional: No fever/chills Cardiovascular: Denies chest pain. Respiratory: Denies shortness of breath. Gastrointestinal:   No nausea, no vomiting.  Musculoskeletal: Positive for left wrist pain. Skin: Negative for rash. Neurological: Negative for  focal weakness or numbness.  ____________________________________________  PHYSICAL EXAM:  VITAL SIGNS: ED Triage Vitals  Enc Vitals Group     BP 02/24/20 0718 119/66     Pulse Rate 02/24/20 0718 62     Resp 02/24/20 0718 18     Temp 02/24/20 0718 98.3 F (36.8 C)     Temp Source 02/24/20 0718 Oral     SpO2 02/24/20 0718 98 %     Weight 02/24/20 0719 260 lb (117.9 kg)     Height 02/24/20 0719 5\' 8"  (1.727 m)     Head Circumference --      Peak Flow --      Pain Score 02/24/20 0721 6     Pain Loc --      Pain Edu? --      Excl. in [place]? --    Constitutional: Alert and oriented. Well appearing and in no acute distress. Eyes: Conjunctivae are normal.  Head: Atraumatic. Neck: No stridor.    Cardiovascular: Normal rate, regular rhythm. Grossly normal heart sounds.  Good peripheral circulation. Respiratory: Normal respiratory effort.  No retractions. Lungs CTAB. Musculoskeletal: No gross deformity is noted of the left wrist.  There is some tenderness on the ulnar aspect of the wrist.  Minimal soft tissue edema.  Range of motion is without restriction in flexion and extension.  Skin is intact.  No warmth or erythema noted.  Pulses present.  Motor sensory function intact distal digits. Neurologic:  Normal speech and language. No gross focal neurologic deficits are appreciated. No gait instability. Skin:  Skin is warm, dry and intact. No rash noted. Psychiatric: Mood and affect are normal. Speech and behavior are normal.  ____________________________________________   LABS (all labs ordered are listed, but only abnormal results are displayed)  Labs Reviewed - No data to display ____________________________________________   RADIOLOGY   Official radiology report(s): DG Wrist Complete Left  Result Date: 02/24/2020 CLINICAL DATA:  Left wrist pain EXAM: LEFT WRIST - COMPLETE 3+ VIEW COMPARISON:  None. FINDINGS: At the ulnar aspect of the wrist just proximal to the pisiform are two adjacent 2 mm bony fragments which may reflect tiny fracture fragments. No definite donor site is evident. Osseous structures appear otherwise intact. Normal alignment. Joint spaces are maintained. Soft tissues within normal limits. IMPRESSION: Two adjacent 2 mm bony fragments at the ulnar aspect of the wrist just proximal to the pisiform may reflect tiny fracture fragments. No definite donor site is evident. Electronically Signed   By: Davina Poke D.O.   On: 02/24/2020 08:04    ____________________________________________   PROCEDURES  Procedure(s) performed (including Critical Care):  Procedures Cock-up wrist splint was applied  ____________________________________________   INITIAL  IMPRESSION / ASSESSMENT AND PLAN / ED COURSE  As part of my medical decision making, I reviewed the following data within the electronic MEDICAL RECORD NUMBER Notes from prior ED visits and [place] Controlled Substance Database  34 year old male presents to the ED with complaint of left wrist pain without a known injury.  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  He does not remember any injuries growing up as a child.  He has not taken any over-the-counter medication.  X-ray does show 2 small bone fragments on the ulnar aspect.  Patient states he is more tender there.  He was placed in a cock-up wrist splint for support and protection and prescribed meloxicam to take daily for inflammation.  If not improving then  he should follow-up with Dr. Leim Fabry.   ____________________________________________   FINAL CLINICAL IMPRESSION(S) / ED DIAGNOSES  Final diagnoses:  Acute wrist pain, left     ED Discharge Orders         Ordered    meloxicam (MOBIC) 15 MG tablet  Daily     02/24/20 0843           Note:  This document was prepared using Dragon voice recognition software and may include unintentional dictation errors.    Johnn Hai, PA-C 02/24/20 1102    Duffy Bruce, MD 02/24/20 2031

## 2020-02-24 NOTE — ED Notes (Signed)
Says his left wrist has been bothering him for couple days.  Says today it is increased pain and swollen. No injury.  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  Good circulation, motion and sensation.

## 2020-02-24 NOTE — Discharge Instructions (Signed)
Follow-up [facility] if any continued problems with your wrist.  Wear the wrist brace every day for protection and support until your wrist is no longer hurting.  Begin taking meloxicam 1 daily with food.  Ice and elevation if needed for pain.

## 2020-02-24 NOTE — ED Triage Notes (Signed)
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 Unsure of injury,

## 2020-02-24 NOTE — ED Notes (Signed)
velcro splint to left wrist.  Instructed to loosen if it feels too tight.  He says it feels good now. Good circulation and sensation distally

## 2020-06-09 ENCOUNTER — Emergency Department
Admission: EM | Admit: 2020-06-09 | Discharge: 2020-06-09 | Disposition: A | Discharge status: Home / Self Care | Payer: Self-pay | Attending: Emergency Medicine | Admitting: Emergency Medicine

## 2020-06-09 ENCOUNTER — Emergency Department: Payer: Self-pay

## 2020-06-09 ENCOUNTER — Other Ambulatory Visit: Payer: Self-pay

## 2020-06-09 ENCOUNTER — Encounter: Payer: Self-pay | Admitting: Emergency Medicine

## 2020-06-09 DIAGNOSIS — R202 Paresthesia of skin: Secondary | ICD-10-CM | POA: Insufficient documentation

## 2020-06-09 DIAGNOSIS — M79652 Pain in left thigh: Secondary | ICD-10-CM | POA: Insufficient documentation

## 2020-06-09 DIAGNOSIS — F17290 Nicotine dependence, other tobacco product, uncomplicated: Secondary | ICD-10-CM | POA: Insufficient documentation

## 2020-06-09 DIAGNOSIS — M79605 Pain in left leg: Secondary | ICD-10-CM | POA: Insufficient documentation

## 2020-06-09 DIAGNOSIS — Z7982 Long term (current) use of aspirin: Secondary | ICD-10-CM | POA: Insufficient documentation

## 2020-06-09 LAB — URINALYSIS, COMPLETE (UACMP) WITH MICROSCOPIC
Bacteria, UA: NONE SEEN
Bilirubin Urine: NEGATIVE
Glucose, UA: 50 mg/dL — AB
Hgb urine dipstick: NEGATIVE
Ketones, ur: NEGATIVE mg/dL
Leukocytes,Ua: NEGATIVE
Nitrite: NEGATIVE
Protein, ur: NEGATIVE mg/dL
Specific Gravity, Urine: 1.017 (ref 1.005–1.030)
Squamous Epithelial / HPF: NONE SEEN (ref 0–5)
pH: 5 (ref 5.0–8.0)

## 2020-06-09 MED ORDER — CYCLOBENZAPRINE HCL 5 MG PO TABS: 5.0000 mg | ORAL_TABLET | Freq: Three times a day (TID) | ORAL | 0 refills | Status: DC | PRN

## 2020-06-09 NOTE — ED Triage Notes (Signed)
C/O mid lower back pain a few weeks ago, back pain has resolved, now c/o left foot and leg pain and numbness, intermittently throughout the day x 4 days.  AAOx3.  Skin warm and dry. NAD

## 2020-06-09 NOTE — Discharge Instructions (Signed)
Your exam, ultrasound, and XR are normal at this time. Take the prescription medications as prescribed.  Take over-the-counter ibuprofen for pain relief.  [facility] or return to the ED as needed for ongoing symptoms.

## 2020-06-09 NOTE — ED Provider Notes (Signed)
[facility] Emergency Department Provider Note ____________________________________________  Time seen: 1711  I have reviewed the triage vital signs and the nursing notes.  HISTORY  Chief Complaint  Leg Pain  HPI Dennis Harvey is a 34 y.o. male presents himself to the ED for evaluation of intermittent left lower extremity numbness and pain.  Patient describes over the last 4 days he has had some anterior shin and foot numbness and tingling, as well as some posterior left thigh muscle pain.  He denies any recent injury, trauma, fall.  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  He reports a few weeks ago he had an acute muscle strain to his back, and notes that that resolved without incident.  Since that time he has had this recent episode of left lower extremity pain and paresthesias.  Past Medical History:  Diagnosis Date   Allergy    SEASONAL   ARF (acute renal failure) ([place]) 05/18/2016   Environmental allergies    GERD (gastroesophageal reflux disease)     Patient Active Problem List   Diagnosis Date Noted   Atypical chest pain 03/05/2017   Acute renal failure (ARF) ([place]) 05/20/2016   Hyperglycemia 05/20/2016   Appendicitis 05/18/2016    Past Surgical History:  Procedure Laterality Date   NO PAST SURGERIES      Prior to Admission medications   Medication Sig Start Date End Date Taking? Authorizing Provider  aspirin 81 MG chewable tablet Chew 1 tablet (81 mg total) by mouth daily. 03/04/17   Clayton Bibles, PA-C  cyclobenzaprine (FLEXERIL) 5 MG tablet Take 1 tablet (5 mg total) by mouth 3 (three) times daily as needed. 06/09/20   Angelyn Osterberg, Dannielle Karvonen, PA-C  pantoprazole (PROTONIX) 40 MG tablet Take 1 tablet (40 mg total) by mouth daily. 01/13/18   Deno Etienne, DO  sucralfate (CARAFATE) 1 GM/10ML suspension Take 10 mLs (1 g total) by mouth 4 (four) times daily. Gargle with it and then swallow it. 10/31/17  10/31/18  Nena Polio, MD   Allergies Patient has no known allergies.  Family History  Problem Relation Age of Onset   Diabetes Maternal Grandmother    Heart failure Maternal Grandmother    Hyperlipidemia Maternal Grandmother    Hypertension Maternal Grandmother    Diabetes Father    Hypertension Father    Deep vein thrombosis Father    Heart block Father        has stents, non-smoker   Heart disease Father    Prostate cancer Father    Colon cancer Neg Hx    Throat cancer Neg Hx    Esophageal cancer Neg Hx     Social History Social History   Tobacco Use   Smoking status: Light Tobacco Smoker    Years: 2.00    Types: Cigars   Smokeless tobacco: Never Used   Tobacco comment: 05/21/2016 "stopped smoking cigarettes in ~ 2007"  Vaping Use   Vaping Use: Never used  Substance Use Topics   Alcohol use: Yes    Alcohol/week: 1.0 standard drink    Types: 1 Shots of liquor per week    Comment: SOCIALLY   Drug use: No    Review of Systems  Constitutional: Negative for fever. Cardiovascular: Negative for chest pain. Respiratory: Negative for shortness of breath. Gastrointestinal: Negative for abdominal pain, vomiting and diarrhea. Genitourinary: Negative for dysuria. Musculoskeletal: Negative for back pain. LLE paresthesias  Skin: Negative for rash. Neurological: Negative for headaches,  focal weakness or numbness. ____________________________________________  PHYSICAL EXAM:  VITAL SIGNS: ED Triage Vitals  Enc Vitals Group     BP 06/09/20 1425 (!) 142/77     Pulse Rate 06/09/20 1425 90     Resp 06/09/20 1425 16     Temp 06/09/20 1425 (!) 97 F (36.1 C)     Temp Source 06/09/20 1425 Oral     SpO2 06/09/20 1425 99 %     Weight 06/09/20 1429 259 lb 14.8 oz (117.9 kg)     Height 06/09/20 1429 5\' 8"  (1.727 m)     Head Circumference --      Peak Flow --      Pain Score 06/09/20 1429 0     Pain Loc --      Pain Edu? --      Excl. in [place]? --      Constitutional: Alert and oriented. Well appearing and in no distress. Head: Normocephalic and atraumatic. Eyes: Conjunctivae are normal.  3 normal extraocular movements Cardiovascular: Normal rate, regular rhythm. Normal distal pulses. Respiratory: Normal respiratory effort. No wheezes/rales/rhonchi. Musculoskeletal: Lower extremity without any obvious deformity, dislocation, joint effusion, or skin changes.  Patient with full active range of motion to the left extremity without signs of internal derangement to the knee or ankle.  Nontender with normal range of motion in all extremities.  Neurologic: CN II-XII grossly intact. Normal LE DTRs bilaterally. Normal gait without ataxia.  Normal gross sensation.  Normal speech and language. No gross focal neurologic deficits are appreciated. Skin:  Skin is warm, dry and intact. No rash noted.  Clubbing, cyanosis, or edema is noted ____________________________________________   RADIOLOGY  US Venous Doppler LLE IMPRESSION: Negative.  DG Lumbar Spine IMPRESSION: Mild degenerative change at L5-S1. _______________________________________________  LABORATORY  Labs Reviewed  URINALYSIS, COMPLETE (UACMP) WITH MICROSCOPIC - Abnormal; Notable for the following components:      Result Value   Color, Urine YELLOW (*)    APPearance CLEAR (*)    Glucose, UA 50 (*)    All other components within normal limits  ____________________________________________  PROCEDURES  Procedures ____________________________________________  INITIAL IMPRESSION / ASSESSMENT AND PLAN / ED COURSE  DDX: lumbar radiculopathy, DVT, myalgia, arthralgia  Patient with ED evaluation of intermittent complaints of both low back pain as well as some left foot and shin patient's exam is overall benign reassuring at this time.  No radiologic evidence of an occlusive DVT.  No x-ray evidence of lumbar compression injury or lytic lesion.  Patient's exam pulses distally and  warm, dry, skin.  Patient will be treated for his paresthesias with a prescription for muscle relaxant.  Is also advised to take previously prescribed anti-inflammatories.  He is reassured by his negative work-up today.  He will follow-up with primary provider for ongoing symptoms.  Return precautions have been discussed and reviewed  Dennis Harvey was evaluated in Emergency Department on 06/10/2020 for the symptoms described in the history of present illness. He was evaluated in the context of the global COVID-19 pandemic, which necessitated consideration that the patient might be at risk for infection with the SARS-CoV-2 virus that causes COVID-19. Institutional protocols and algorithms that pertain to the evaluation of patients at risk for COVID-19 are in a state of rapid change based on information released by regulatory bodies including the CDC and federal and state organizations. These policies and algorithms were followed during the patient's care in the ED. ____________________________________________  FINAL CLINICAL IMPRESSION(S) / ED DIAGNOSES  Final  diagnoses:  Left leg pain  Musculoskeletal leg pain, left      Fraser Busche, Dannielle Karvonen, PA-C 06/10/20 1856    Delman Kitten, MD 06/13/20 [phone]

## 2020-10-24 ENCOUNTER — Emergency Department (HOSPITAL_COMMUNITY)
Admission: EM | Admit: 2020-10-24 | Discharge: 2020-10-24 | Disposition: A | Discharge status: Home / Self Care | Payer: Self-pay | Attending: Emergency Medicine | Admitting: Emergency Medicine

## 2020-10-24 ENCOUNTER — Other Ambulatory Visit: Payer: Self-pay

## 2020-10-24 ENCOUNTER — Encounter (HOSPITAL_COMMUNITY): Payer: Self-pay

## 2020-10-24 DIAGNOSIS — R202 Paresthesia of skin: Secondary | ICD-10-CM

## 2020-10-24 DIAGNOSIS — Z7982 Long term (current) use of aspirin: Secondary | ICD-10-CM | POA: Insufficient documentation

## 2020-10-24 DIAGNOSIS — L309 Dermatitis, unspecified: Secondary | ICD-10-CM

## 2020-10-24 DIAGNOSIS — F17290 Nicotine dependence, other tobacco product, uncomplicated: Secondary | ICD-10-CM | POA: Insufficient documentation

## 2020-10-24 DIAGNOSIS — K047 Periapical abscess without sinus: Secondary | ICD-10-CM

## 2020-10-24 MED ORDER — NAPROXEN 500 MG PO TABS: 500.0000 mg | ORAL_TABLET | Freq: Two times a day (BID) | ORAL | 0 refills | Status: DC

## 2020-10-24 MED ORDER — MUPIROCIN 2 % EX OINT: 1.0000 "application " | TOPICAL_OINTMENT | Freq: Three times a day (TID) | CUTANEOUS | 0 refills | Status: DC

## 2020-10-24 MED ORDER — HYDROCORTISONE 1 % EX CREA: TOPICAL_CREAM | CUTANEOUS | 0 refills | Status: DC

## 2020-10-24 MED ORDER — PENICILLIN V POTASSIUM 500 MG PO TABS: 500.0000 mg | ORAL_TABLET | Freq: Four times a day (QID) | ORAL | 0 refills | Status: AC

## 2020-10-24 NOTE — ED Triage Notes (Signed)
Patient arrived stating that two days ago he started having upper left sided dental pain and took ibuprofen last night which resolved pain but states when he woke up this morning he felt numbness in bilateral arms and legs, states it has resolved some but not completely. Patient able to move all extremities.

## 2020-10-24 NOTE — ED Provider Notes (Signed)
[facility] DEPT Provider Note   CSN: [id number] Arrival date & time: 10/24/20  9518     History Chief Complaint  Patient presents with  . Dental Pain  . Numbness         Dennis Harvey is a 34 y.o. male.  HPI   Patient presented to the ED for few complaints. 1st thing the patient has been having difficulty with his dental pain. Patient states that is a sharp and aching discomfort in the left upper maxillary region. It hurts for him to try to chew and eat. Has been taking ibuprofen which has helped the pain. He has noticed some mild swelling on left side of his face. No fevers or chills. He has not seen a dentist. No difficulty with swallowing or speaking  Patient also has noticed a slight rash around his face and on his left lip. Patient thinks it started after he ate at a [employer]. This was about a week ago. He has not tried any treatment. No difficulty swallowing. No fevers or chills.  Patient also states that last night when he woke up he had some numbness in both his arms and his legs. It has all resolved. He has not noticed any difficulty with his vision speech. No prior issues with numbness or weakness.  Past Medical History:  Diagnosis Date  . Allergy    SEASONAL  . ARF (acute renal failure) ([place]) 05/18/2016  . Environmental allergies   . GERD (gastroesophageal reflux disease)     Patient Active Problem List   Diagnosis Date Noted  . Atypical chest pain 03/05/2017  . Acute renal failure (ARF) ([place]) 05/20/2016  . Hyperglycemia 05/20/2016  . Appendicitis 05/18/2016    Past Surgical History:  Procedure Laterality Date  . NO PAST SURGERIES         Family History  Problem Relation Age of Onset  . Diabetes Maternal Grandmother   . Heart failure Maternal Grandmother   . Hyperlipidemia Maternal Grandmother   . Hypertension Maternal Grandmother   . Diabetes Father   . Hypertension Father   . Deep vein thrombosis  Father   . Heart block Father        has stents, non-smoker  . Heart disease Father   . Prostate cancer Father   . Colon cancer Neg Hx   . Throat cancer Neg Hx   . Esophageal cancer Neg Hx     Social History   Tobacco Use  . Smoking status: Light Tobacco Smoker    Years: 2.00    Types: Cigars  . Smokeless tobacco: Never Used  . Tobacco comment: 05/21/2016 "stopped smoking cigarettes in ~ 2007"  Vaping Use  . Vaping Use: Never used  Substance Use Topics  . Alcohol use: Yes    Alcohol/week: 1.0 standard drink    Types: 1 Shots of liquor per week    Comment: SOCIALLY  . Drug use: No    Home Medications Prior to Admission medications   Medication Sig Start Date End Date Taking? Authorizing Provider  aspirin 81 MG chewable tablet Chew 1 tablet (81 mg total) by mouth daily. 03/04/17   Clayton Bibles, PA-C  cyclobenzaprine (FLEXERIL) 5 MG tablet Take 1 tablet (5 mg total) by mouth 3 (three) times daily as needed. 06/09/20   Menshew, Dannielle Karvonen, PA-C  hydrocortisone cream 1 % Apply to affected area 2 times daily 10/24/20   Dorie Rank, MD  mupirocin ointment (BACTROBAN) 2 % Apply 1 application  topically 3 (three) times daily. 10/24/20   Dorie Rank, MD  naproxen (NAPROSYN) 500 MG tablet Take 1 tablet (500 mg total) by mouth 2 (two) times daily with a meal. As needed for pain 10/24/20   Dorie Rank, MD  pantoprazole (PROTONIX) 40 MG tablet Take 1 tablet (40 mg total) by mouth daily. 01/13/18   Deno Etienne, DO  penicillin v potassium (VEETID) 500 MG tablet Take 1 tablet (500 mg total) by mouth 4 (four) times daily for 7 days. 10/24/20 10/31/20  Dorie Rank, MD  sucralfate (CARAFATE) 1 GM/10ML suspension Take 10 mLs (1 g total) by mouth 4 (four) times daily. Gargle with it and then swallow it. 10/31/17 10/31/18  Nena Polio, MD    Allergies    Patient has no known allergies.  Review of Systems   Review of Systems  All other systems reviewed and are negative.   Physical Exam Updated  Vital Signs BP (!) 158/101 (BP Location: Left Arm)   Pulse 91   Temp 98.2 F (36.8 C) (Oral)   Resp 17   Ht 1.727 m (5\' 8" )   SpO2 98%   BMI 39.52 kg/m   Physical Exam Vitals and nursing note reviewed.  Constitutional:      General: He is not in acute distress.    Appearance: He is well-developed and well-nourished.  HENT:     Head: Normocephalic and atraumatic.     Right Ear: External ear normal.     Left Ear: External ear normal.     Mouth/Throat:     Comments: Mild tenderness palpation left maxillary region, no obvious dental caries or swelling, dental tenderness palpation in that region Eyes:     General: No scleral icterus.       Right eye: No discharge.        Left eye: No discharge.     Conjunctiva/sclera: Conjunctivae normal.  Neck:     Trachea: No tracheal deviation.  Cardiovascular:     Rate and Rhythm: Normal rate and regular rhythm.     Pulses: Intact distal pulses.  Pulmonary:     Effort: Pulmonary effort is normal. No respiratory distress.     Breath sounds: Normal breath sounds. No stridor. No wheezing or rales.  Abdominal:     General: Bowel sounds are normal. There is no distension.     Palpations: Abdomen is soft.     Tenderness: There is no abdominal tenderness. There is no guarding or rebound.  Musculoskeletal:        General: No tenderness or edema.     Cervical back: Neck supple.  Skin:    General: Skin is warm and dry.     Comments: Fine macular papular rash, slightly erythematous on the left perioral region right around his beard line, no vesicles or pustules  Neurological:     Mental Status: He is alert.     Cranial Nerves: No cranial nerve deficit (no facial droop, extraocular movements intact, no slurred speech).     Sensory: No sensory deficit.     Motor: No abnormal muscle tone or seizure activity.     Coordination: Coordination normal.     Deep Tendon Reflexes: Strength normal.     Comments: Equal strength and sensation in all  extremities  Psychiatric:        Mood and Affect: Mood and affect normal.     ED Results / Procedures / Treatments   Labs (all labs ordered are listed, but only abnormal results  are displayed) Labs Reviewed - No data to display  EKG None  Radiology No results found.  Procedures Procedures (including critical care time)  Medications Ordered in ED Medications - No data to display  ED Course  I have reviewed the triage vital signs and the nursing notes.  Pertinent labs & imaging results that were available during my care of the patient were reviewed by me and considered in my medical decision making (see chart for details).    MDM Rules/Calculators/A&P                          Patient is dental pain most likely related to dental caries. He does have some swelling and tenderness. I will prescribe antibiotics and recommend outpatient follow-up with a dentist.  Regarding the patient's facial rash. Unclear if this is a dermatitis versus a very mild case of impetigo. We will have him try some steroid and antibiotic cream outpatient follow-up.  Regarding the numbness patient has no focal deficits. I doubt stroke TIA MS or other worrisome etiology concerning symptoms involved all 4 extremities.Marland Kitchen He is asymptomatic now with a normal exam.  Final Clinical Impression(s) / ED Diagnoses Final diagnoses:  Paresthesia  Dental infection  Dermatitis    Rx / DC Orders ED Discharge Orders         Ordered    mupirocin ointment (BACTROBAN) 2 %  3 times daily        10/24/20 0754    hydrocortisone cream 1 %        10/24/20 0754    penicillin v potassium (VEETID) 500 MG tablet  4 times daily        10/24/20 0754    naproxen (NAPROSYN) 500 MG tablet  2 times daily with meals        10/24/20 0754           Dorie Rank, MD 10/24/20 [phone]

## 2020-10-24 NOTE — ED Notes (Signed)
Pt given discharge paperwork and good rx card

## 2020-10-24 NOTE — Discharge Instructions (Addendum)
Take medications as prescribed. Make sure to schedule appointment with a dentist. Consider following up with your primary care provider

## 2021-05-22 ENCOUNTER — Emergency Department (HOSPITAL_COMMUNITY)
Admission: EM | Admit: 2021-05-22 | Discharge: 2021-05-23 | Disposition: A | Discharge status: Home / Self Care | Payer: Self-pay | Attending: Student | Admitting: Student

## 2021-05-22 ENCOUNTER — Other Ambulatory Visit: Payer: Self-pay

## 2021-05-22 ENCOUNTER — Encounter (HOSPITAL_COMMUNITY): Payer: Self-pay | Admitting: Emergency Medicine

## 2021-05-22 DIAGNOSIS — R42 Dizziness and giddiness: Secondary | ICD-10-CM | POA: Insufficient documentation

## 2021-05-22 DIAGNOSIS — Z5321 Procedure and treatment not carried out due to patient leaving prior to being seen by health care provider: Secondary | ICD-10-CM | POA: Insufficient documentation

## 2021-05-22 DIAGNOSIS — E860 Dehydration: Secondary | ICD-10-CM | POA: Insufficient documentation

## 2021-05-22 DIAGNOSIS — R519 Headache, unspecified: Secondary | ICD-10-CM | POA: Insufficient documentation

## 2021-05-22 DIAGNOSIS — R61 Generalized hyperhidrosis: Secondary | ICD-10-CM | POA: Insufficient documentation

## 2021-05-22 LAB — CBC WITH DIFFERENTIAL/PLATELET
Abs Immature Granulocytes: 0 10*3/uL (ref 0.00–0.07)
Basophils Absolute: 0 10*3/uL (ref 0.0–0.1)
Basophils Relative: 0 %
Eosinophils Absolute: 0.3 10*3/uL (ref 0.0–0.5)
Eosinophils Relative: 4 %
HCT: 39.6 % (ref 39.0–52.0)
Hemoglobin: 12.7 g/dL — ABNORMAL LOW (ref 13.0–17.0)
Lymphocytes Relative: 39 %
Lymphs Abs: 3 10*3/uL (ref 0.7–4.0)
MCH: 29.3 pg (ref 26.0–34.0)
MCHC: 32.1 g/dL (ref 30.0–36.0)
MCV: 91.2 fL (ref 80.0–100.0)
Monocytes Absolute: 0.5 10*3/uL (ref 0.1–1.0)
Monocytes Relative: 6 %
Neutro Abs: 3.9 10*3/uL (ref 1.7–7.7)
Neutrophils Relative %: 51 %
Platelets: 313 10*3/uL (ref 150–400)
RBC: 4.34 MIL/uL (ref 4.22–5.81)
RDW: 12.4 % (ref 11.5–15.5)
WBC: 7.6 10*3/uL (ref 4.0–10.5)
nRBC: 0 % (ref 0.0–0.2)
nRBC: 1 /100 WBC — ABNORMAL HIGH

## 2021-05-22 LAB — URINALYSIS, ROUTINE W REFLEX MICROSCOPIC
Bilirubin Urine: NEGATIVE
Glucose, UA: NEGATIVE mg/dL
Hgb urine dipstick: NEGATIVE
Ketones, ur: 20 mg/dL — AB
Leukocytes,Ua: NEGATIVE
Nitrite: NEGATIVE
Protein, ur: NEGATIVE mg/dL
Specific Gravity, Urine: 1.026 (ref 1.005–1.030)
pH: 5 (ref 5.0–8.0)

## 2021-05-22 LAB — BASIC METABOLIC PANEL
Anion gap: 7 (ref 5–15)
BUN: 12 mg/dL (ref 6–20)
CO2: 25 mmol/L (ref 22–32)
Calcium: 8.5 mg/dL — ABNORMAL LOW (ref 8.9–10.3)
Chloride: 102 mmol/L (ref 98–111)
Creatinine, Ser: 1.32 mg/dL — ABNORMAL HIGH (ref 0.61–1.24)
GFR, Estimated: >60 mL/min (ref >60)
Glucose, Bld: 99 mg/dL (ref 70–99)
Potassium: 3.7 mmol/L (ref 3.5–5.1)
Sodium: 134 mmol/L — ABNORMAL LOW (ref 135–145)

## 2021-05-22 NOTE — ED Provider Notes (Signed)
Emergency Medicine Provider Triage Evaluation Note  Dennis Harvey , a 35 y.o. male  was evaluated in triage.  Pt complains of fatigue, headaches, and dark urine x1.5 weeks. No dysuria. No chest pain or shortness of breath. Admits to night sweats. No known fever. Denies abdominal pain, nausea, vomiting, and diarrhea. Patient notes he feels dehydrated. Test negative for COVID last week.  Review of Systems  Positive: fatigue Negative: CP/SOB  Physical Exam  BP 126/82 (BP Location: Left Arm)   Pulse 96   Temp 98.7 F (37.1 C) (Oral)   Resp 17   Ht 5\' 8"  (1.727 m)   Wt 117.9 kg   SpO2 98%   BMI 39.53 kg/m  Gen:   Awake, no distress   Resp:  Normal effort  MSK:   Moves extremities without difficulty  Other:   Medical Decision Making  Medically screening exam initiated at 6:39 PM.  Appropriate orders placed.  Dennis Harvey was informed that the remainder of the evaluation will be completed by another provider, this initial triage assessment does not replace that evaluation, and the importance of remaining in the ED until their evaluation is complete.  Labs UA   Dennis Harvey 05/22/21 1841    Pattricia Boss, MD 05/22/21 2143

## 2021-05-22 NOTE — ED Triage Notes (Signed)
Onset one week ago states feels dehydrated, headaches, and fatigue. States when patient sleeping he has been diaphoretic.

## 2021-05-23 ENCOUNTER — Encounter (HOSPITAL_COMMUNITY): Payer: Self-pay

## 2021-05-23 ENCOUNTER — Emergency Department (HOSPITAL_COMMUNITY)
Admission: EM | Admit: 2021-05-23 | Discharge: 2021-05-23 | Disposition: A | Discharge status: Home / Self Care | Payer: Self-pay | Attending: Emergency Medicine | Admitting: Emergency Medicine

## 2021-05-23 ENCOUNTER — Other Ambulatory Visit: Payer: Self-pay

## 2021-05-23 DIAGNOSIS — R5383 Other fatigue: Secondary | ICD-10-CM | POA: Insufficient documentation

## 2021-05-23 DIAGNOSIS — Z7982 Long term (current) use of aspirin: Secondary | ICD-10-CM | POA: Insufficient documentation

## 2021-05-23 DIAGNOSIS — F17290 Nicotine dependence, other tobacco product, uncomplicated: Secondary | ICD-10-CM | POA: Insufficient documentation

## 2021-05-23 DIAGNOSIS — R519 Headache, unspecified: Secondary | ICD-10-CM | POA: Insufficient documentation

## 2021-05-23 DIAGNOSIS — R61 Generalized hyperhidrosis: Secondary | ICD-10-CM | POA: Insufficient documentation

## 2021-05-23 DIAGNOSIS — Z20822 Contact with and (suspected) exposure to covid-19: Secondary | ICD-10-CM | POA: Insufficient documentation

## 2021-05-23 DIAGNOSIS — M79604 Pain in right leg: Secondary | ICD-10-CM

## 2021-05-23 DIAGNOSIS — M79605 Pain in left leg: Secondary | ICD-10-CM

## 2021-05-23 DIAGNOSIS — M79661 Pain in right lower leg: Secondary | ICD-10-CM | POA: Insufficient documentation

## 2021-05-23 DIAGNOSIS — M79662 Pain in left lower leg: Secondary | ICD-10-CM | POA: Insufficient documentation

## 2021-05-23 LAB — RESP PANEL BY RT-PCR (FLU A&B, COVID) ARPGX2
Influenza A by PCR: NEGATIVE
Influenza B by PCR: NEGATIVE
SARS Coronavirus 2 by RT PCR: NEGATIVE

## 2021-05-23 LAB — CBC WITH DIFFERENTIAL/PLATELET
Abs Immature Granulocytes: 0.05 10*3/uL (ref 0.00–0.07)
Basophils Absolute: 0.1 10*3/uL (ref 0.0–0.1)
Basophils Relative: 1 %
Eosinophils Absolute: 0.2 10*3/uL (ref 0.0–0.5)
Eosinophils Relative: 2 %
HCT: 42.7 % (ref 39.0–52.0)
Hemoglobin: 13.5 g/dL (ref 13.0–17.0)
Immature Granulocytes: 1 %
Lymphocytes Relative: 47 %
Lymphs Abs: 3.9 10*3/uL (ref 0.7–4.0)
MCH: 28.9 pg (ref 26.0–34.0)
MCHC: 31.6 g/dL (ref 30.0–36.0)
MCV: 91.4 fL (ref 80.0–100.0)
Monocytes Absolute: 0.5 10*3/uL (ref 0.1–1.0)
Monocytes Relative: 6 %
Neutro Abs: 3.5 10*3/uL (ref 1.7–7.7)
Neutrophils Relative %: 43 %
Platelets: 315 10*3/uL (ref 150–400)
RBC: 4.67 MIL/uL (ref 4.22–5.81)
RDW: 12.7 % (ref 11.5–15.5)
WBC: 8.2 10*3/uL (ref 4.0–10.5)
nRBC: 0 % (ref 0.0–0.2)

## 2021-05-23 LAB — COMPREHENSIVE METABOLIC PANEL
ALT: 27 U/L (ref 0–44)
AST: 24 U/L (ref 15–41)
Albumin: 3.9 g/dL (ref 3.5–5.0)
Alkaline Phosphatase: 55 U/L (ref 38–126)
Anion gap: 7 (ref 5–15)
BUN: 14 mg/dL (ref 6–20)
CO2: 25 mmol/L (ref 22–32)
Calcium: 8.9 mg/dL (ref 8.9–10.3)
Chloride: 106 mmol/L (ref 98–111)
Creatinine, Ser: 1.18 mg/dL (ref 0.61–1.24)
GFR, Estimated: >60 mL/min (ref >60)
Glucose, Bld: 105 mg/dL — ABNORMAL HIGH (ref 70–99)
Potassium: 3.9 mmol/L (ref 3.5–5.1)
Sodium: 138 mmol/L (ref 135–145)
Total Bilirubin: 0.8 mg/dL (ref 0.3–1.2)
Total Protein: 8.3 g/dL — ABNORMAL HIGH (ref 6.5–8.1)

## 2021-05-23 LAB — URINALYSIS, ROUTINE W REFLEX MICROSCOPIC
Bilirubin Urine: NEGATIVE
Glucose, UA: NEGATIVE mg/dL
Hgb urine dipstick: NEGATIVE
Ketones, ur: 20 mg/dL — AB
Leukocytes,Ua: NEGATIVE
Nitrite: NEGATIVE
Protein, ur: NEGATIVE mg/dL
Specific Gravity, Urine: 1.023 (ref 1.005–1.030)
pH: 5 (ref 5.0–8.0)

## 2021-05-23 LAB — CK: Total CK: 257 U/L (ref 49–397)

## 2021-05-23 NOTE — ED Provider Notes (Signed)
Emergency Medicine Provider Triage Evaluation Note  Dennis Harvey , a 35 y.o. male  was evaluated in triage.  Pt complains of intermittent headaches, night sweats, bilateral lower extremity pain, and fatigue x1 week.  Patient also endorses dark urine.  No dysuria.  No abdominal pain.  Denies chest pain and shortness of breath.  Denies lower extremity edema.  Patient presented to [facility]  ED yesterday however, left due to long wait times.  Patient returns to the ED today due to concerns about kidney function after he checked his labs on MyChart.  Mild elevation in creatinine at 1.32 with normal BUN yesterday.  Review of Systems  Positive: fatigue Negative: CP  Physical Exam  BP (!) 141/75 (BP Location: Left Arm)   Pulse (!) 108   Temp 98.8 F (37.1 C) (Oral)   Resp 16   Ht 5\' 8"  (1.727 m)   Wt 117.9 kg   SpO2 96%   BMI 39.53 kg/m  Gen:   Awake, no distress   Resp:  Normal effort  MSK:   Moves extremities without difficulty  Other:    Medical Decision Making  Medically screening exam initiated at 4:48 PM.  Appropriate orders placed.  [name] was informed that the remainder of the evaluation will be completed by another provider, this initial triage assessment does not replace that evaluation, and the importance of remaining in the ED until their evaluation is complete.  Repeat labs to compare from yesterday EKG due to tachycardia   Dennis Harvey 05/23/21 1650    Dennis Natal, MD 05/23/21 2228

## 2021-05-23 NOTE — ED Provider Notes (Signed)
[place] DEPT Provider Note   CSN: [id number] Arrival date & time: 05/23/21  1535     History Chief Complaint  Patient presents with   Leg Pain    Dennis Harvey is a 35 y.o. male.  HPI Patient is a 35 year old male with past medical history significant for acute renal failure thought to be due to dehydration  Patient is presented to the emergency department today with some intermittent headaches, fatigue, some occasional diaphoresis, bilateral lower extremity aches and pains.  Fatigue for the past week.  He states he has had some dark urine denies any urinary frequency urgency or dysuria.  Denies any abdominal pain chest pain or shortness of breath.  No nausea vomiting or diarrhea.  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  Patient to go to the ER to be seen yesterday however left without being seen.      Past Medical History:  Diagnosis Date   Allergy    SEASONAL   ARF (acute renal failure) ([place]) 05/18/2016   Environmental allergies    GERD (gastroesophageal reflux disease)     Patient Active Problem List   Diagnosis Date Noted   Atypical chest pain 03/05/2017   Acute renal failure (ARF) ([place]) 05/20/2016   Hyperglycemia 05/20/2016   Appendicitis 05/18/2016    Past Surgical History:  Procedure Laterality Date   NO PAST SURGERIES         Family History  Problem Relation Age of Onset   Diabetes Maternal Grandmother    Heart failure Maternal Grandmother    Hyperlipidemia Maternal Grandmother    Hypertension Maternal Grandmother    Diabetes Father    Hypertension Father    Deep vein thrombosis Father    Heart block Father        has stents, non-smoker   Heart disease Father    Prostate cancer Father    Colon cancer Neg Hx    Throat cancer Neg Hx    Esophageal cancer Neg Hx     Social History   Tobacco Use   Smoking status: Light Smoker    Types: Cigars    Smokeless tobacco: Never   Tobacco comments:    05/21/2016 "stopped smoking cigarettes in ~ 2007"  Vaping Use   Vaping Use: Never used  Substance Use Topics   Alcohol use: Yes    Alcohol/week: 1.0 standard drink    Types: 1 Shots of liquor per week    Comment: SOCIALLY   Drug use: No    Home Medications Prior to Admission medications   Medication Sig Start Date End Date Taking? Authorizing Provider  cetirizine (ZYRTEC) 10 MG tablet Take 10 mg by mouth daily as needed for allergies or rhinitis.   Yes [provider]  FAMOTIDINE PO Take 1 tablet by mouth daily as needed (for acid reflux symptoms).   Yes [provider]  aspirin 81 MG chewable tablet Chew 1 tablet (81 mg total) by mouth daily. Patient not taking: No sig reported 03/04/17   [place]zerba[place], Emily, PA-C  cyclobenzaprine (FLEXERIL) 5 MG tablet Take 1 tablet (5 mg total) by mouth 3 (three) times daily as needed. Patient not taking: No sig reported 06/09/20   Menshew, Dannielle Karvonen, PA-C  hydrocortisone cream 1 % Apply to affected area 2 times daily Patient not taking: No sig reported 10/24/20   Dorie Rank, MD  mupirocin ointment (BACTROBAN) 2 % Apply 1 application topically  3 (three) times daily. Patient not taking: Reported on 05/23/2021 10/24/20   Dorie Rank, MD  naproxen (NAPROSYN) 500 MG tablet Take 1 tablet (500 mg total) by mouth 2 (two) times daily with a meal. As needed for pain Patient not taking: No sig reported 10/24/20   Dorie Rank, MD  pantoprazole (PROTONIX) 40 MG tablet Take 1 tablet (40 mg total) by mouth daily. Patient not taking: No sig reported 01/13/18   Deno Etienne, DO  sucralfate (CARAFATE) 1 GM/10ML suspension Take 10 mLs (1 g total) by mouth 4 (four) times daily. Gargle with it and then swallow it. Patient not taking: No sig reported 10/31/17 05/23/21  Nena Polio, MD    Allergies    Patient has no known allergies.  Review of Systems   Review of Systems  Constitutional:  Positive for  diaphoresis (Occasional) and fatigue. Negative for chills and fever.  HENT:  Negative for congestion.   Eyes:  Negative for pain.  Respiratory:  Negative for cough and shortness of breath.   Cardiovascular:  Negative for chest pain and leg swelling.  Gastrointestinal:  Negative for abdominal pain and vomiting.  Genitourinary:  Negative for dysuria.  Musculoskeletal:  Positive for myalgias.       Bilateral leg aches  Skin:  Negative for rash.  Neurological:  Negative for dizziness and headaches.   Physical Exam Updated Vital Signs BP 135/79   Pulse 88   Temp 98.8 F (37.1 C) (Oral)   Resp 18   Ht 5\' 8"  (1.727 m)   Wt 117.9 kg   SpO2 99%   BMI 39.53 kg/m   Physical Exam Vitals and nursing note reviewed.  Constitutional:      General: He is not in acute distress.    Comments: Pleasant well-appearing 35 year old.  In no acute distress.  Sitting comfortably in bed.  Able answer questions appropriately follow commands. No increased work of breathing. Speaking in full sentences.   HENT:     Head: Normocephalic and atraumatic.     Nose: Nose normal.  Eyes:     General: No scleral icterus. Cardiovascular:     Rate and Rhythm: Normal rate and regular rhythm.     Pulses: Normal pulses.     Heart sounds: Normal heart sounds.  Pulmonary:     Effort: Pulmonary effort is normal. No respiratory distress.     Breath sounds: No wheezing.  Abdominal:     Palpations: Abdomen is soft.     Tenderness: There is no abdominal tenderness.  Musculoskeletal:     Cervical back: Normal range of motion.     Right lower leg: No edema.     Left lower leg: No edema.     Comments: No lower extremity edema.  No calf tenderness.  Bilateral DP PT pulses are 3+ and symmetric.  Sensation intact in all aspects of foot.  Strength 5/5 in bilateral lower extremities at all joints.  Ambulatory.  Skin:    General: Skin is warm and dry.     Capillary Refill: Capillary refill takes less than 2 seconds.   Neurological:     Mental Status: He is alert. Mental status is at baseline.  Psychiatric:        Mood and Affect: Mood normal.        Behavior: Behavior normal.    ED Results / Procedures / Treatments   Labs (all labs ordered are listed, but only abnormal results are displayed) Labs Reviewed  COMPREHENSIVE METABOLIC PANEL -  Abnormal; Notable for the following components:      Result Value   Glucose, Bld 105 (*)    Total Protein 8.3 (*)    All other components within normal limits  URINALYSIS, ROUTINE W REFLEX MICROSCOPIC - Abnormal; Notable for the following components:   Color, Urine AMBER (*)    Ketones, ur 20 (*)    All other components within normal limits  RESP PANEL BY RT-PCR (FLU A&B, COVID) ARPGX2  CBC WITH DIFFERENTIAL/PLATELET  CK    EKG None  Radiology No results found.  Procedures Procedures   Medications Ordered in ED Medications - No data to display  ED Course  I have reviewed the triage vital signs and the nursing notes.  Pertinent labs & imaging results that were available during my care of the patient were reviewed by me and considered in my medical decision making (see chart for details).    MDM Rules/Calculators/A&P                           Patient is a 35 year old male presented today with some fatigue over the past week as well as some occasional night sweats.  Will run to the ER today however is bilateral leg aches.  He has a history of acute renal failure was hospitalized for this several years ago.  He works outside and states he gets dehydrated easily.  He has been trying to hydrate as well as he can however came to the ER for evaluation.  Given his bilateral leg pain we will obtain CK and lab work.  CBC unremarkable CMP also unremarkable.  CK within normal limits.  Urinalysis with amber appearance but normal specific gravity.  COVID influenza negative.  Patient given some reassurance here.  He will hydrate at home follow-up closely with  primary care provider.  Return precautions given.  At no point during my evaluation patient did not have any abnormal vital signs.  He did have some abnormal vital signs found in triage however these were not present on my examination in the room.  Patient ambulatory to discharge.  Agreeable to close follow-up with PCP and return precautions for any new or concerning symptoms.  Final Clinical Impression(s) / ED Diagnoses Final diagnoses:  Pain in both lower extremities    Rx / DC Orders ED Discharge Orders     None        Tedd Sias, [place] 05/24/21 0005    Lacretia Leigh, MD 05/24/21 1535

## 2021-05-23 NOTE — Discharge Instructions (Addendum)
Your work-up today was quite reassuring.  Please drink plenty of water use Voltaren gel to areas that are aching in your legs.  Minimize alcohol intake. Follow up with your primary care provider.

## 2021-05-23 NOTE — ED Triage Notes (Signed)
Patient c/o diaphoresis when sleeping. Patient states he has increased his water intake and noted that he was not urinating as much as he was taking in.  Patient was at [facility] for the same and left due to wait times. Patient states he had labs completed last night.

## 2021-06-24 ENCOUNTER — Other Ambulatory Visit: Payer: Self-pay

## 2021-06-24 ENCOUNTER — Ambulatory Visit
Admission: EM | Admit: 2021-06-24 | Discharge: 2021-06-24 | Disposition: A | Discharge status: Home / Self Care | Payer: BC Managed Care – PPO | Attending: Emergency Medicine | Admitting: Emergency Medicine

## 2021-06-24 DIAGNOSIS — R369 Urethral discharge, unspecified: Secondary | ICD-10-CM

## 2021-06-24 LAB — POCT URINALYSIS DIP (MANUAL ENTRY)
Bilirubin, UA: NEGATIVE
Glucose, UA: NEGATIVE mg/dL
Ketones, POC UA: NEGATIVE mg/dL
Nitrite, UA: NEGATIVE
Protein Ur, POC: NEGATIVE mg/dL
Spec Grav, UA: 1.015 (ref 1.010–1.025)
Urobilinogen, UA: 1 E.U./dL
pH, UA: 6.5 (ref 5.0–8.0)

## 2021-06-24 NOTE — ED Provider Notes (Signed)
SUBJECTIVE:  Dennis Harvey is a very pleasant 35 y.o. Male presents with concerns due to increased urinary frequency and penile discharge for which he noticed yesterday.  Patient reports frothy yellowish discharge.  He reports no painful urination, penile pain, fever, abdominal pain, hematuria, testicular pain.  Patient does not report any new sexual partners and states that he is in a monogamous relationship with his girlfriend for the last 8 years.  ROS: General/Constitutional: No fever, chills, or sweats GI: No abdominal pain, nausea/vomiting or diarrhea GU: As above Genitalia: As above Lymph: No swelling, red streaks or swollen lymph nodes Skin: No rashes or skin lesions   OBJECTIVE: Vitals:   06/24/21 1439  BP: 129/80  Pulse: 90  Resp: 18  Temp: 98.7 F (37.1 C)  SpO2: 97%     General: Appears well-developed and well-nourished. No acute distress.  Cardiovascular: Normal rate  Pulm/Chest: No respiratory distress.  Neurological: Alert and oriented to person, place, and time.  Skin: Skin is warm and dry.  Psychiatric: Normal mood, affect, behavior, and thought content.  Genitalia: Deferred secondary to self collect specimen  Laboratory:  Orders Placed This Encounter  Procedures   Urine Culture   POCT urinalysis dipstick   Results for orders placed or performed during the hospital encounter of 06/24/21  POCT urinalysis dipstick  Result Value Ref Range   Color, UA yellow yellow   Clarity, UA clear clear   Glucose, UA negative negative mg/dL   Bilirubin, UA negative negative   Ketones, POC UA negative negative mg/dL   Spec Grav, UA 1.015 1.010 - 1.025   Blood, UA trace-intact (A) negative   pH, UA 6.5 5.0 - 8.0   Protein Ur, POC negative negative mg/dL   Urobilinogen, UA 1.0 0.2 or 1.0 E.U./dL   Nitrite, UA Negative Negative   Leukocytes, UA Trace (A) Negative     Assessment: 1. Penile discharge - POCT urinalysis dipstick; Standing - Urine Culture;  Standing - POCT urinalysis dipstick - Urine Culture - Cytology (oral, anal, urethral) ancillary only; Standing - Cytology (oral, anal, urethral) ancillary only   Plan:  MDM: Patient presents with concerns due to increased urinary frequency and penile discharge for which he noticed yesterday.  Patient reports frothy yellowish discharge.  He reports no painful urination, penile pain, fever, abdominal pain, hematuria, testicular pain.  Patient does not report any new sexual partners and states that he is in a monogamous relationship with his girlfriend for the last 8 years.  Chart review completed.  Urinalysis reveals trace blood and trace leukocytes.  Less concern at this time for UTI, but did send urine for culture given trace leukocytes and trace blood.  Obtained a cytology swab in clinic today to test for gonorrhea, chlamydia and trichomonas.  Advised the patient that we will call with any positive results that require treatment negative results would upload directly to his MyChart account.  Patient verbalized understanding and agreed with plan.  Patient stable upon discharge.  Return as needed.  Instructions:    Discharge Instructions      Your testing has been sent to the lab.  If your testing results as positive, you will be notified via phone and we will initiate treatment at that time.  If you do not receive a phone call from [place] within the next 2 to 3 days, check your MyChart for updated health information. Do not engage in sex until you know your results. If you test positive you will need to abstain from  sex for 7 days and notify all partners.  Your urine sample today was sent for a culture.  If your urine indicates that you have an infection in the urine, we will call you to initiate antibiotic therapy.  If you do not receive a call in the next 3-4 days, it means that you do not need antibiotics for a urinary tract infection.  Please go to the ER for continued nausea, vomiting, unilateral  back pain, or worsening urinary symptoms.          Serafina Royals, [place] 06/24/21 1551

## 2021-06-24 NOTE — ED Triage Notes (Signed)
Patient presents to Urgent Care with complaints of increased urinary frequency and penile discharge since yesterday. Pt states he noted some "frothy yellowish discharge."   Denies fever, abdominal pain, hematuria.

## 2021-06-24 NOTE — Discharge Instructions (Signed)
Your testing has been sent to the lab.  If your testing results as positive, you will be notified via phone and we will initiate treatment at that time.  If you do not receive a phone call from [place] within the next 2 to 3 days, check your MyChart for updated health information. Do not engage in sex until you know your results. If you test positive you will need to abstain from sex for 7 days and notify all partners.  Your urine sample today was sent for a culture.  If your urine indicates that you have an infection in the urine, we will call you to initiate antibiotic therapy.  If you do not receive a call in the next 3-4 days, it means that you do not need antibiotics for a urinary tract infection.  Please go to the ER for continued nausea, vomiting, unilateral back pain, or worsening urinary symptoms.

## 2021-06-26 LAB — URINE CULTURE: Culture: NO GROWTH

## 2021-06-26 LAB — CYTOLOGY, (ORAL, ANAL, URETHRAL) ANCILLARY ONLY
Chlamydia: POSITIVE — AB
Comment: NEGATIVE
Comment: NEGATIVE
Comment: NORMAL
Neisseria Gonorrhea: NEGATIVE
Trichomonas: NEGATIVE

## 2021-06-27 ENCOUNTER — Telehealth (HOSPITAL_COMMUNITY): Payer: Self-pay | Admitting: Emergency Medicine

## 2021-06-27 MED ORDER — DOXYCYCLINE HYCLATE 100 MG PO CAPS: 100.0000 mg | ORAL_CAPSULE | Freq: Two times a day (BID) | ORAL | 0 refills | Status: AC

## 2021-10-01 ENCOUNTER — Emergency Department (HOSPITAL_COMMUNITY)
Admission: EM | Admit: 2021-10-01 | Discharge: 2021-10-01 | Disposition: A | Discharge status: Home / Self Care | Payer: Self-pay | Attending: Emergency Medicine | Admitting: Emergency Medicine

## 2021-10-01 ENCOUNTER — Encounter (HOSPITAL_COMMUNITY): Payer: Self-pay

## 2021-10-01 ENCOUNTER — Emergency Department (HOSPITAL_COMMUNITY): Payer: Self-pay

## 2021-10-01 ENCOUNTER — Other Ambulatory Visit: Payer: Self-pay

## 2021-10-01 DIAGNOSIS — R220 Localized swelling, mass and lump, head: Secondary | ICD-10-CM | POA: Insufficient documentation

## 2021-10-01 DIAGNOSIS — Z872 Personal history of diseases of the skin and subcutaneous tissue: Secondary | ICD-10-CM

## 2021-10-01 DIAGNOSIS — Z7982 Long term (current) use of aspirin: Secondary | ICD-10-CM | POA: Insufficient documentation

## 2021-10-01 DIAGNOSIS — F17290 Nicotine dependence, other tobacco product, uncomplicated: Secondary | ICD-10-CM | POA: Insufficient documentation

## 2021-10-01 DIAGNOSIS — L723 Sebaceous cyst: Secondary | ICD-10-CM | POA: Insufficient documentation

## 2021-10-01 LAB — COMPREHENSIVE METABOLIC PANEL
ALT: 17 U/L (ref 0–44)
AST: 16 U/L (ref 15–41)
Albumin: 4.1 g/dL (ref 3.5–5.0)
Alkaline Phosphatase: 59 U/L (ref 38–126)
Anion gap: 6 (ref 5–15)
BUN: 13 mg/dL (ref 6–20)
CO2: 27 mmol/L (ref 22–32)
Calcium: 9.7 mg/dL (ref 8.9–10.3)
Chloride: 103 mmol/L (ref 98–111)
Creatinine, Ser: 1.2 mg/dL (ref 0.61–1.24)
GFR, Estimated: >60 mL/min (ref >60)
Glucose, Bld: 99 mg/dL (ref 70–99)
Potassium: 3.9 mmol/L (ref 3.5–5.1)
Sodium: 136 mmol/L (ref 135–145)
Total Bilirubin: 1 mg/dL (ref 0.3–1.2)
Total Protein: 8.5 g/dL — ABNORMAL HIGH (ref 6.5–8.1)

## 2021-10-01 LAB — LACTIC ACID, PLASMA: Lactic Acid, Venous: 1.4 mmol/L (ref 0.5–1.9)

## 2021-10-01 LAB — CBC WITH DIFFERENTIAL/PLATELET
Abs Immature Granulocytes: 0.03 10*3/uL (ref 0.00–0.07)
Basophils Absolute: 0 10*3/uL (ref 0.0–0.1)
Basophils Relative: 0 %
Eosinophils Absolute: 0.1 10*3/uL (ref 0.0–0.5)
Eosinophils Relative: 1 %
HCT: 43 % (ref 39.0–52.0)
Hemoglobin: 14 g/dL (ref 13.0–17.0)
Immature Granulocytes: 0 %
Lymphocytes Relative: 44 %
Lymphs Abs: 4.2 10*3/uL — ABNORMAL HIGH (ref 0.7–4.0)
MCH: 30 pg (ref 26.0–34.0)
MCHC: 32.6 g/dL (ref 30.0–36.0)
MCV: 92.3 fL (ref 80.0–100.0)
Monocytes Absolute: 0.8 10*3/uL (ref 0.1–1.0)
Monocytes Relative: 8 %
Neutro Abs: 4.5 10*3/uL (ref 1.7–7.7)
Neutrophils Relative %: 47 %
Platelets: 336 10*3/uL (ref 150–400)
RBC: 4.66 MIL/uL (ref 4.22–5.81)
RDW: 13.1 % (ref 11.5–15.5)
WBC: 9.6 10*3/uL (ref 4.0–10.5)
nRBC: 0 % (ref 0.0–0.2)

## 2021-10-01 MED ORDER — SODIUM CHLORIDE 0.9 % IV SOLN
2.0000 g | Freq: Once | INTRAVENOUS | Status: AC
  Administered 2021-10-01: 11:00:00 2 g via INTRAVENOUS
  Filled 2021-10-01: qty 20

## 2021-10-01 MED ORDER — CEFAZOLIN SODIUM-DEXTROSE 2-4 GM/100ML-% IV SOLN
2.0000 g | Freq: Once | INTRAVENOUS | Status: DC
  Filled 2021-10-01: qty 100

## 2021-10-01 MED ORDER — SULFAMETHOXAZOLE-TRIMETHOPRIM 800-160 MG PO TABS: 1.0000 | ORAL_TABLET | Freq: Two times a day (BID) | ORAL | 0 refills | Status: AC

## 2021-10-01 NOTE — Progress Notes (Addendum)
I was contacted by Dr. Harlow Asa and the ER staff regarding Dennis Harvey's diagnosis and treatment. His CT images were reviewed.  Dennis Harvey has multiple large scalp masses bilaterally, possibly secondary to an undiagnosed syndrome. The largest scalp mass is over the right temporal scalp, above the right ear. The right ear is not involved in the tumor. Drainage is noted from the large scalp mass. According to Dr. Harlow Asa, he is convinced this is a malignant tumor. The CT scan also showed likely adenopathy within the right parotid gland. Based on the above info. 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 Treatment of this condition is beyond the scope of my ENT practice. I have never seen or treated a malignant scalp tumor in my 20+ years of clinical practice. The patient will likely need to see a surgical dermatologist (e.g. Mohs surgeon), possibly in conjunction with a head and neck surgeon and plastic reconstructive surgeon. Due to his insurance status, Dr. Harlow Asa thinks the patient will benefit from referral to [facility].   His CT report: CT Head Wo Contrast Result Date: 10/01/2021 CLINICAL DATA:  Headache, new or worsening. Fluctuant positional masses. Infected right temporal cyst EXAM: CT HEAD WITHOUT CONTRAST TECHNIQUE: Contiguous axial images were obtained from the base of the skull through the vertex without intravenous contrast. COMPARISON:  None. FINDINGS: Brain: No evidence of infarction, hemorrhage, hydrocephalus, extra-axial collection or mass lesion/mass effect. Vascular: No hyperdense vessel or unexpected calcification. Skull: Normal. Negative for fracture or focal lesion. Sinuses/Orbits: Partial opacification of bilateral maxillary sinuses. Other: 5 scalp masses. The 3 largest and most posterior have central isodensity to low-density and peripheral mineralized high-density appearance suggesting dermal inclusion cysts and measuring up to 5.3 cm.  Smaller subcutaneous mass along the left frontal scalp anteriorly which measures 2.2 cm is presumably the same but does show internal gas, possibly open and draining. The most dominant scalp masses along the right temporal convexity at 7.3 cm maximum with ill-defined lobulated margins, heterogeneous internal density, and regional fat stranding extending into the face. Lobulated mass at the superficial right parotid measuring 2.5 cm. IMPRESSION: 1. Numerous scalp masses as described. The dominant ~7 cm mass along the right temporal scalp is very heterogeneous with regional edema and probable right parotid adenopathy. This mass could reflect neoplasm or superinfection. Recommend surgical referral. 2. Smaller left frontal scalp cyst measuring 2.2 cm with internal gas which may reflect opening to the skin surface. 3. Three other dominant scalp masses suggest dermal inclusion cysts. 4. No calvarial or intracranial abnormality. Electronically Signed   By: Jorje Guild M.D.   On: 10/01/2021 07:47

## 2021-10-01 NOTE — ED Provider Notes (Signed)
[facility] DEPT Provider Note   CSN: [id number] Arrival date & time: 10/01/21  0144     History Chief Complaint  Patient presents with   Cyst   Facial Swelling    Dennis Harvey is a 35 y.o. male with a past medical history of GERD presenting today with a complaint of a draining cyst to his right forehead as well as facial swelling.  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  Over the past couple of weeks the one on his right forehead has become painful and began to drain.  Usually these abscesses do not drain.  Denies fevers or chills.  Has had this cyst drained years ago however it has come back.  Is without insurance and unable to see a specialist.  Also with decreased hearing in his right ear.  No recent illness.  Past Medical History:  Diagnosis Date   Allergy    SEASONAL   ARF (acute renal failure) ([place]) 05/18/2016   Environmental allergies    GERD (gastroesophageal reflux disease)     Patient Active Problem List   Diagnosis Date Noted   Atypical chest pain 03/05/2017   Acute renal failure (ARF) ([place]) 05/20/2016   Hyperglycemia 05/20/2016   Appendicitis 05/18/2016    Past Surgical History:  Procedure Laterality Date   NO PAST SURGERIES         Family History  Problem Relation Age of Onset   Diabetes Maternal Grandmother    Heart failure Maternal Grandmother    Hyperlipidemia Maternal Grandmother    Hypertension Maternal Grandmother    Diabetes Father    Hypertension Father    Deep vein thrombosis Father    Heart block Father        has stents, non-smoker   Heart disease Father    Prostate cancer Father    Colon cancer Neg Hx    Throat cancer Neg Hx    Esophageal cancer Neg Hx     Social History   Tobacco Use   Smoking status: Light Smoker    Types: Cigars   Smokeless tobacco: Never   Tobacco comments:    05/21/2016 "stopped smoking cigarettes in  ~ 2007"  Vaping Use   Vaping Use: Never used  Substance Use Topics   Alcohol use: Yes    Alcohol/week: 1.0 standard drink    Types: 1 Shots of liquor per week    Comment: SOCIALLY   Drug use: No    Home Medications Prior to Admission medications   Medication Sig Start Date End Date Taking? Authorizing Provider  aspirin 81 MG chewable tablet Chew 1 tablet (81 mg total) by mouth daily. Patient not taking: No sig reported 03/04/17   Azerbaijan, Emily, PA-C  cetirizine (ZYRTEC) 10 MG tablet Take 10 mg by mouth daily as needed for allergies or rhinitis.    [provider]  cyclobenzaprine (FLEXERIL) 5 MG tablet Take 1 tablet (5 mg total) by mouth 3 (three) times daily as needed. Patient not taking: No sig reported 06/09/20   Menshew, Dannielle Karvonen, PA-C  FAMOTIDINE PO Take 1 tablet by mouth daily as needed (for acid reflux symptoms).    [provider]  hydrocortisone cream 1 % Apply to affected area 2 times daily Patient not taking: No sig reported 10/24/20   Dorie Rank, MD  mupirocin ointment (BACTROBAN) 2 % Apply 1 application topically 3 (three) times daily. Patient not  taking: No sig reported 10/24/20   Dorie Rank, MD  naproxen (NAPROSYN) 500 MG tablet Take 1 tablet (500 mg total) by mouth 2 (two) times daily with a meal. As needed for pain Patient not taking: No sig reported 10/24/20   Dorie Rank, MD  pantoprazole (PROTONIX) 40 MG tablet Take 1 tablet (40 mg total) by mouth daily. Patient not taking: No sig reported 01/13/18   Deno Etienne, DO  sucralfate (CARAFATE) 1 GM/10ML suspension Take 10 mLs (1 g total) by mouth 4 (four) times daily. Gargle with it and then swallow it. Patient not taking: No sig reported 10/31/17 05/23/21  Nena Polio, MD    Allergies    Patient has no known allergies.  Review of Systems   Review of Systems  Constitutional:  Negative for chills and fever.  HENT:  Positive for ear discharge and hearing loss. Negative for congestion and ear pain.    Skin:  Positive for wound.  Neurological:  Negative for dizziness and headaches.   Physical Exam Updated Vital Signs BP 124/78 (BP Location: Left Arm)   Pulse 89   Temp 98.1 F (36.7 C) (Oral)   Resp 15   Ht 5\' 8"  (1.727 m)   Wt 117.9 kg   SpO2 98%   BMI 39.53 kg/m   Physical Exam Vitals and nursing note reviewed.  Constitutional:      Appearance: Normal appearance.  HENT:     Head: Atraumatic.     Comments: Multiple fluctuant cysts to the patient's head.  Photo in chart.  There are 3 that measures around 6 to 7 cm on the back of his head, 1 measuring around 6 cm to the right forehead and one 1 to 2 cm abscess to his left forehead. Area on right side of skull is non-fluctuant and more consistent with a mass. Oozing purulent discharge.  Mobile preauricular and parotid lymphadenopathy on the right side.  Small amounts of swelling around eyebrow.    Left Ear: Tympanic membrane, ear canal and external ear normal.     Ears:     Comments: Apparent puslike drainage in the ear canal.  TM intact. Eyes:     General: No scleral icterus.    Conjunctiva/sclera: Conjunctivae normal.  Pulmonary:     Effort: Pulmonary effort is normal. No respiratory distress.  Skin:    Findings: No rash.  Neurological:     Mental Status: He is alert.  Psychiatric:        Mood and Affect: Mood normal.    ED Results / Procedures / Treatments   Labs (all labs ordered are listed, but only abnormal results are displayed) Labs Reviewed  CBC WITH DIFFERENTIAL/PLATELET - Abnormal; Notable for the following components:      Result Value   Lymphs Abs 4.2 (*)    All other components within normal limits  COMPREHENSIVE METABOLIC PANEL - Abnormal; Notable for the following components:   Total Protein 8.5 (*)    All other components within normal limits  LACTIC ACID, PLASMA    EKG None  Radiology CT Head Wo Contrast  Result Date: 10/01/2021 CLINICAL DATA:  Headache, new or worsening. Fluctuant  positional masses. Infected right temporal cyst EXAM: CT HEAD WITHOUT CONTRAST TECHNIQUE: Contiguous axial images were obtained from the base of the skull through the vertex without intravenous contrast. COMPARISON:  None. FINDINGS: Brain: No evidence of infarction, hemorrhage, hydrocephalus, extra-axial collection or mass lesion/mass effect. Vascular: No hyperdense vessel or unexpected calcification. Skull: Normal. Negative  for fracture or focal lesion. Sinuses/Orbits: Partial opacification of bilateral maxillary sinuses. Other: 5 scalp masses. The 3 largest and most posterior have central isodensity to low-density and peripheral mineralized high-density appearance suggesting dermal inclusion cysts and measuring up to 5.3 cm. Smaller subcutaneous mass along the left frontal scalp anteriorly which measures 2.2 cm is presumably the same but does show internal gas, possibly open and draining. The most dominant scalp masses along the right temporal convexity at 7.3 cm maximum with ill-defined lobulated margins, heterogeneous internal density, and regional fat stranding extending into the face. Lobulated mass at the superficial right parotid measuring 2.5 cm. IMPRESSION: 1. Numerous scalp masses as described. The dominant ~7 cm mass along the right temporal scalp is very heterogeneous with regional edema and probable right parotid adenopathy. This mass could reflect neoplasm or superinfection. Recommend surgical referral. 2. Smaller left frontal scalp cyst measuring 2.2 cm with internal gas which may reflect opening to the skin surface. 3. Three other dominant scalp masses suggest dermal inclusion cysts. 4. No calvarial or intracranial abnormality. Electronically Signed   By: Jorje Guild M.D.   On: 10/01/2021 07:47    Procedures Procedures   Medications Ordered in ED Medications - No data to display  ED Course  I have reviewed the triage vital signs and the nursing notes.  Pertinent labs & imaging results  that were available during my care of the patient were reviewed by me and considered in my medical decision making (see chart for details).    MDM Rules/Calculators/A&P Patient with multiple abscesses to school.  He reports that he has had these for years and inherited something from his father.  The one on his right forehead is more solid and actively draining.  CT head ordered and revealed a 7 cm mass with right parotid adenopathy.  Potentially a neoplasm or superinfection. Recommending surgical referral.    I called our general surgeon and Dr. Harlow Asa suggested that I consult with ENT due to the parotid involvement and call him back if needed.  ENT, Dr. Benjamine Mola, states that he does not handle issues of the scalp.  Is happy to see the patient in his office for the potential right parotid adenopathy.  Dr. Harlow Asa came to the emergency department to see the patient, recommends starting him on antibiotics and discharging him on Bactrim with a referral to community health and wellness to help get him financial help to see specialist.  Diagnosis suspicious for malignancy.  I discussed this with the patient and he voiced understanding.  He is uninsured and having difficulty with follow-up.  Hopefully will be able to obtain assistance via community health and wellness.  Final Clinical Impression(s) / ED Diagnoses Final diagnoses:  Localized swelling, mass, and lump of head  Hx of sebaceous cyst    Rx / DC Orders Results and diagnoses were explained to the patient. Return precautions discussed in full. Patient had no additional questions and expressed complete understanding.     Rhae Hammock, PA-C 10/01/21 1108    Wynona Dove A, DO 10/01/21 1616

## 2021-10-01 NOTE — ED Notes (Signed)
Pt requested light snack prior to d/c. Antibiotic still currently infusing and pt was informed that he would be d/c once completed

## 2021-10-01 NOTE — Progress Notes (Signed)
     I was asked to evaluate this patient by Mervyn Gay, PA, from the emergency department.  I saw the patient and examined him with [facility].  I reviewed his CT scans.  I discussed these findings with the patient.  Patient has a large irregular mass over the right ear with superficial ulceration and some drainage.  It is not fluctuant.  It is almost certainly neoplastic and almost certainly malignant.  He has palpable nodularity anterior to the right ear and involving the right parotid gland.  I discussed this case with Dr. Benjamine Mola from ENT.  He states that he does not manage this type of problem.  Patient will likely require referral to a tertiary care center where head neck surgery can coordinate with plastic surgery and possibly with radiation therapy depending on the final diagnosis.  I recommended to [facility] that the patient be started on an oral antibiotic such as Bactrim for 7 days.  I recommended referral to [facility] as the patient does not have any insurance.  Hopefully they could help with referral, presumably to [facility] at [facility], for evaluation.  This is certainly out of the realm of general surgery and my practice has no experience in managing this type of problem.  Hopefully the patient will follow-up as requested and obtain referral to the appropriate center for evaluation and management.  Armandina Gemma, MD [facility] [place] practice Office: [phone]

## 2021-10-01 NOTE — Discharge Instructions (Addendum)
Please be sure to make an appointment with the clinic attached to your discharge papers.  This is very important as they likely need to get you in with specialist outside of the [facility] health system to evaluate and likely surgically remove and reconstruct this area.  It is suspicious for a cancer therefore it is very important that you follow-up.  I am putting you on 7 days worth of antibiotics.  Please take these as prescribed.

## 2021-10-01 NOTE — ED Triage Notes (Signed)
Pt reports with a cyst to the right side of his head that is swollen and causing facial swelling. Pt states that the cyst has been draining. 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

## 2021-10-05 ENCOUNTER — Telehealth: Payer: Self-pay

## 2021-10-05 NOTE — Telephone Encounter (Signed)
Copied from [place] [id number]. Topic: Appointment Scheduling - Scheduling Inquiry for Clinic >> Oct 02, 2021 10:15 AM Robina Ade, Helene Kelp D wrote: Reason for CRM: Patient needs a new patient/ hospital f/u. Schedule until 11/27/21 for new patient but would like to be seen sooner please.   Called patient no answer. Left vm to call [phone] to get sooner hosp f/u with [name]

## 2021-11-27 ENCOUNTER — Encounter: Payer: Self-pay | Admitting: Internal Medicine

## 2021-11-27 ENCOUNTER — Other Ambulatory Visit (HOSPITAL_COMMUNITY): Payer: Self-pay

## 2021-11-27 ENCOUNTER — Other Ambulatory Visit: Payer: Self-pay

## 2021-11-27 ENCOUNTER — Ambulatory Visit: Payer: 59 | Attending: Internal Medicine | Admitting: Internal Medicine

## 2021-11-27 VITALS — BP 127/84 | HR 81 | Resp 16 | Ht 68.0 in | Wt 249.0 lb

## 2021-11-27 DIAGNOSIS — F17210 Nicotine dependence, cigarettes, uncomplicated: Secondary | ICD-10-CM

## 2021-11-27 DIAGNOSIS — R220 Localized swelling, mass and lump, head: Secondary | ICD-10-CM | POA: Diagnosis not present

## 2021-11-27 DIAGNOSIS — R590 Localized enlarged lymph nodes: Secondary | ICD-10-CM | POA: Insufficient documentation

## 2021-11-27 DIAGNOSIS — H6021 Malignant otitis externa, right ear: Secondary | ICD-10-CM | POA: Diagnosis not present

## 2021-11-27 DIAGNOSIS — H9191 Unspecified hearing loss, right ear: Secondary | ICD-10-CM

## 2021-11-27 DIAGNOSIS — Z7689 Persons encountering health services in other specified circumstances: Secondary | ICD-10-CM

## 2021-11-27 MED ORDER — CIPRO HC 0.2-1 % OT SUSP
3.0000 [drp] | Freq: Two times a day (BID) | OTIC | 0 refills | Status: DC
  Filled 2021-11-27: qty 10, 31d supply, fill #0

## 2021-11-27 MED ORDER — OFLOXACIN 0.3 % OT SOLN
10.0000 [drp] | Freq: Every day | OTIC | 0 refills | Status: DC
  Filled 2021-11-27: qty 5, 10d supply, fill #0

## 2021-11-27 NOTE — Progress Notes (Signed)
Patient ID: Dennis Harvey, male    DOB: December 14, 1985  MRN: [id number]  CC: new pt visit  Subjective: Dennis Harvey is a 36 y.o. male who presents for new pt visit His concerns today include:  Hx GERD, obesity  No Previous PCP No chronic medical issues Here for f/u from ER for cysts on scalp He was seen in the ER on 10/01/2021 for draining cysts on his right temporal area and left forehead.  They did a CAT scan of the head after he was noted to have several large cysts on the scalp.  CAT scan revealed: IMPRESSION: 1. Numerous scalp masses as described. The dominant ~7 cm mass along the right temporal scalp is very heterogeneous with regional edema and probable right parotid adenopathy. This mass could reflect neoplasm or superinfection. Recommend surgical referral. 2. Smaller left frontal scalp cyst measuring 2.2 cm with internal gas which may reflect opening to the skin surface. 3. Three other dominant scalp masses suggest dermal inclusion cysts. 4. No calvarial or intracranial abnormality.  He was seen by ENT specialist Dr. Benjamine Mola while there in the ER.  He recommended referral to dermatology at [facility].  Today: Patient reports that the cyst on his scalp have been there since childhood but were much smaller.  They have gotten progressively larger in his adulthood particularly the one in the right temporal area.   One in RT temple area busted in 2015 while he was in [place], [place].  I&D in ER.  However as soon as it closed up, it started growing again and draining. It has started draining in RT ear. Just started noticing decrease hearing in the RT ear. Throbbing in ear sometimes.  Uses Peroxide and water to flush ear Sometimes he feels like he is being bitten by insects on these cysts.  Also itch at times. Father has hx of sebaceous cysts of scalp and has had several removed.  Patient Active Problem List   Diagnosis Date Noted   Atypical chest pain 03/05/2017   Acute renal failure  (ARF) ([place]) 05/20/2016   Hyperglycemia 05/20/2016   Appendicitis 05/18/2016     No current outpatient medications on file prior to visit.   No current facility-administered medications on file prior to visit.    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  Tobacco Use   Smoking status: Light Smoker    Types: Cigars   Smokeless tobacco: Never   Tobacco comments:    05/21/2016 "stopped smoking cigarettes in ~ 2007"  Vaping Use   Vaping Use: Never used  Substance and Sexual Activity   Alcohol use: Yes    Alcohol/week: 1.0 standard drink    Types: 1 Shots of liquor per week    Comment: SOCIALLY   Drug use: No   Sexual activity: Yes    Birth control/protection: None  Other Topics Concern   Not on file  Social History Narrative   Not on file   Social Determinants of Health   Financial Resource Strain: Not on file  Food Insecurity: Not on file  Transportation Needs: Not on file  Physical Activity: Not on file  Stress: Not on file  Social Connections: Not on file  Intimate Partner Violence: Not on file    Family History  Problem Relation Age of Onset   Diabetes Maternal Grandmother    Heart failure Maternal Grandmother    Hyperlipidemia Maternal Grandmother    Hypertension Maternal Grandmother    Diabetes Father    Hypertension Father    Deep vein thrombosis Father    Heart block Father        has stents, non-smoker   Heart disease Father    Prostate cancer Father    Colon cancer Neg Hx    Throat cancer Neg Hx    Esophageal cancer Neg Hx     Past Surgical History:  Procedure Laterality Date   NO PAST SURGERIES      ROS: Review of Systems Negative except as stated above  PHYSICAL EXAM: BP 127/84     Pulse 81    Resp 16    Ht 5\' 8"  (1.727 m)    Wt 249 lb (112.9 kg)    SpO2 97%    BMI 37.86 kg/m   Physical Exam   General appearance - alert, well appearing, and in no distress Mental status - alert, oriented to person, place, and time Ears: He has white exudative liquid in the right ear canal obscuring view of the eardrum.  Stark Klein is not tender to touch.  He has several preauricular lymphadenopathy on the right side.  Left ear canal and tympanic membrane within normal limits. Neck: Preauricular lymphadenopathy on the right side.  No other cervical lymphadenopathy appreciated. Skin -scalp: Patient has 5 large cysts on the scalp.  Largest 1 is in the right temporal area.  It is firm, hard and draining.  It is nontender to touch.  He has a smaller cyst over the left forehead that is also draining but not as much as the largest one.      CMP Latest Ref Rng & Units 10/01/2021 05/23/2021 05/22/2021  Glucose 70 - 99 mg/dL 99 105(H) 99  BUN 6 - 20 mg/dL 13 14 12   Creatinine 0.61 - 1.24 mg/dL 1.20 1.18 1.32(H)  Sodium 135 - 145 mmol/L 136 138 134(L)  Potassium 3.5 - 5.1 mmol/L 3.9 3.9 3.7  Chloride 98 - 111 mmol/L 103 106 102  CO2 22 - 32 mmol/L 27 25 25   Calcium 8.9 - 10.3 mg/dL 9.7 8.9 8.5(L)  Total Protein 6.5 - 8.1 g/dL 8.5(H) 8.3(H) -  Total Bilirubin 0.3 - 1.2 mg/dL 1.0 0.8 -  Alkaline Phos 38 - 126 U/L 59 55 -  AST 15 - 41 U/L 16 24 -  ALT 0 - 44 U/L 17 27 -   Lipid Panel  No results found for: CHOL, TRIG, HDL, CHOLHDL, VLDL, LDLCALC, LDLDIRECT  CBC    Component Value Date/Time   WBC 9.6 10/01/2021 0817   RBC 4.66 10/01/2021 0817   HGB 14.0 10/01/2021 0817   HCT 43.0 10/01/2021 0817   PLT 336 10/01/2021 0817   MCV 92.3 10/01/2021 0817   MCH 30.0 10/01/2021 0817   MCHC 32.6 10/01/2021 0817   RDW 13.1 10/01/2021 0817   LYMPHSABS 4.2 (H) 10/01/2021 0817   MONOABS 0.8 10/01/2021 0817   EOSABS 0.1 10/01/2021 0817   BASOSABS 0.0 10/01/2021 0817    ASSESSMENT AND PLAN:  1.  Establishing care with new doctor, encounter for   2. Scalp mass We will try to get him to dermatology at [facility] as soon as possible.  I did not give any antibiotics for the cyst in the right  temporal area since this has been chronically draining since 2015 - Ambulatory referral to Dermatology  3. Preauricular lymphadenopathy Likely related to chronic infection in the right ear  4. 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  We will get him to ENT at [facility]. - Ambulatory referral to ENT - ofloxacin (FLOXIN OTIC) 0.3 % OTIC solution; Place 10 drops into the right ear daily.  Dispense: 5 mL; Refill: 0  5. Decreased hearing, right See #4 above - Ambulatory referral to ENT  6. Light smoker Advised to quit.  Discussed health risks associated with smoking.    Patient was given the opportunity to ask questions.  Patient verbalized understanding of the plan and was able to repeat key elements of the plan.   Orders Placed This Encounter  Procedures   Ambulatory referral to ENT   Ambulatory referral to Dermatology     Requested Prescriptions   Signed Prescriptions Disp Refills   ofloxacin (FLOXIN OTIC) 0.3 % OTIC solution 5 mL 0    Sig: Place 10 drops into the right ear daily.    Return if symptoms worsen or fail to improve.  Karle Plumber, MD, FACP

## 2021-11-28 ENCOUNTER — Other Ambulatory Visit (HOSPITAL_COMMUNITY): Payer: Self-pay

## 2021-12-05 ENCOUNTER — Other Ambulatory Visit (HOSPITAL_COMMUNITY): Payer: Self-pay

## 2022-02-12 ENCOUNTER — Ambulatory Visit: Payer: 59 | Admitting: Dermatology

## 2022-07-05 ENCOUNTER — Ambulatory Visit: Payer: Self-pay | Admitting: Dermatology

## 2022-09-20 ENCOUNTER — Telehealth: Payer: Self-pay | Admitting: Urgent Care

## 2022-09-20 ENCOUNTER — Ambulatory Visit
Admission: EM | Admit: 2022-09-20 | Discharge: 2022-09-20 | Disposition: A | Discharge status: Home / Self Care | Payer: Self-pay | Attending: Urgent Care | Admitting: Urgent Care

## 2022-09-20 DIAGNOSIS — Z202 Contact with and (suspected) exposure to infections with a predominantly sexual mode of transmission: Secondary | ICD-10-CM

## 2022-09-20 NOTE — Discharge Instructions (Addendum)
Results of today's testing will be posted to your MyChart account.  As we discussed HSV is a very common virus that is not necessarily spread by sexual contact.

## 2022-09-20 NOTE — Telephone Encounter (Signed)
Pt requests HSV testing during UC visit. Order set not available during standard visit.

## 2022-09-20 NOTE — ED Triage Notes (Addendum)
Pt. Presents to UC requesting STD testing for herpes.Pt. states he was informed by his ex that she may have tested positive for herpes. Pt. Endorses no symptoms.

## 2022-09-20 NOTE — ED Provider Notes (Signed)
Roderic Palau    CSN: [id number] Arrival date & time: 09/20/22  6759      History   Chief Complaint Chief Complaint  Patient presents with   Exposure to STD    HPI Dennis Harvey is a 36 y.o. male.    Exposure to STD    Presents to UC requesting testing for herpes.  Patient states he was informed by his ask that she tested positive for herpes during PAP smear.  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 Authorizing Provider  aspirin 81 MG chewable tablet Chew by mouth. 03/04/17  Yes [provider]  calcium carbonate (OS-CAL) 1250 (500 Ca) MG chewable tablet Chew by mouth.    [provider]  esomeprazole (NEXIUM) 20 MG capsule Take by mouth.    [provider]  loratadine (CLARITIN) 10 MG tablet Take 1 tablet by mouth daily.    [provider]  ofloxacin (FLOXIN OTIC) 0.3 % OTIC solution Place 10 drops into the right ear daily. 11/27/21   Ladell Pier, MD    Family History Family History  Problem Relation Age of Onset   Diabetes Maternal Grandmother    Heart failure Maternal Grandmother    Hyperlipidemia Maternal Grandmother     Hypertension Maternal Grandmother    Diabetes Father    Hypertension Father    Deep vein thrombosis Father    Heart block Father        has stents, non-smoker   Heart disease Father    Prostate cancer Father    Colon cancer Neg Hx    Throat cancer Neg Hx    Esophageal cancer Neg Hx     Social History Social History   Tobacco Use   Smoking status: Light Smoker    Types: Cigars   Smokeless tobacco: Never   Tobacco comments:    05/21/2016 "stopped smoking cigarettes in ~ 2007"  Vaping Use   Vaping Use: Never used  Substance Use Topics   Alcohol use: Yes    Alcohol/week: 1.0 standard drink of alcohol    Types: 1 Shots of liquor per week    Comment: SOCIALLY   Drug use: No     Allergies   Patient has no known allergies.   Review of Systems Review of Systems   Physical Exam Triage Vital Signs ED Triage Vitals [09/20/22 1100]  Enc Vitals Group     BP      Pulse      Resp      Temp      Temp src      SpO2  Weight      Height      Head Circumference      Peak Flow      Pain Score 0     Pain Loc      Pain Edu?      Excl. in [place]?    No data found.  Updated Vital Signs There were no vitals taken for this visit.  Visual Acuity Right Eye Distance:   Left Eye Distance:   Bilateral Distance:    Right Eye Near:   Left Eye Near:    Bilateral Near:     Physical Exam Vitals reviewed.  Constitutional:      Appearance: Normal appearance.  Skin:    General: Skin is warm and dry.  Neurological:     General: No focal deficit present.     Mental Status: He is alert and oriented to person, place, and time.  Psychiatric:        Mood and Affect: Mood normal.        Behavior: Behavior normal.      UC Treatments / Results  Labs (all labs ordered are listed, but only abnormal results are displayed) Labs Reviewed  CYTOLOGY, (ORAL, ANAL, URETHRAL) ANCILLARY ONLY    EKG   Radiology No results found.  Procedures Procedures (including critical  care time)  Medications Ordered in UC Medications - No data to display  Initial Impression / Assessment and Plan / UC Course  I have reviewed the triage vital signs and the nursing notes.  Pertinent labs & imaging results that were available during my care of the patient were reviewed by me and considered in my medical decision making (see chart for details).   Blood obtained for serologic testing for HSV 1 and 2 at patient's request.  Pending.   Final Clinical Impressions(s) / UC Diagnoses   Final diagnoses:  STD exposure   Discharge Instructions   None    ED Prescriptions   None    PDMP not reviewed this encounter.   Rose Phi, [place] 09/20/22 1138

## 2022-09-21 LAB — CYTOLOGY, (ORAL, ANAL, URETHRAL) ANCILLARY ONLY
Chlamydia: NEGATIVE
Comment: NEGATIVE
Comment: NEGATIVE
Comment: NORMAL
Neisseria Gonorrhea: NEGATIVE
Trichomonas: NEGATIVE

## 2022-09-21 LAB — HSV 1 AND 2 AB, IGG
HSV 1 Glycoprotein G Ab, IgG: <0.91 index (ref 0.00–0.90)
HSV 2 IgG, Type Spec: <0.91 index (ref 0.00–0.90)

## 2022-09-21 LAB — SPECIMEN STATUS REPORT

## 2023-02-26 ENCOUNTER — Emergency Department
Admission: EM | Admit: 2023-02-26 | Discharge: 2023-02-26 | Disposition: A | Discharge status: Home / Self Care | Payer: Self-pay | Attending: Emergency Medicine | Admitting: Emergency Medicine

## 2023-02-26 ENCOUNTER — Emergency Department: Payer: Self-pay

## 2023-02-26 ENCOUNTER — Other Ambulatory Visit: Payer: Self-pay

## 2023-02-26 DIAGNOSIS — S8992XA Unspecified injury of left lower leg, initial encounter: Secondary | ICD-10-CM

## 2023-02-26 DIAGNOSIS — S99912A Unspecified injury of left ankle, initial encounter: Secondary | ICD-10-CM | POA: Insufficient documentation

## 2023-02-26 DIAGNOSIS — X501XXA Overexertion from prolonged static or awkward postures, initial encounter: Secondary | ICD-10-CM | POA: Insufficient documentation

## 2023-02-26 MED ORDER — ETODOLAC 400 MG PO TABS: 400.0000 mg | ORAL_TABLET | Freq: Two times a day (BID) | ORAL | 0 refills | Status: DC

## 2023-02-26 NOTE — ED Triage Notes (Signed)
Pt comes with c/o left ankle injury. Pt states he stepped off a ball court and twisted left ankle. Pt states he tried to catch himself and hurt right ankle but left hurts more. Pt states this happened last week.

## 2023-02-26 NOTE — Discharge Instructions (Addendum)
Call to make an appointment with Dr. Logan Bores who is the podiatrist on-call.  His contact information is listed on your discharge papers along with his address.  You need to ice and elevate your ankle to reduce swelling.  Wear the boot anytime you are up walking.  A note was written for you to take to your workplace.  If they are not able to let you work at your job you will need to stay out of work until seen by the podiatrist.

## 2023-02-26 NOTE — ED Provider Notes (Signed)
[facility] Provider Note    Event Date/Time   First MD Initiated Contact with Patient 02/26/23 1149     (approximate)   History   Ankle Pain   HPI  Dennis Harvey is a 37 y.o. male presents to the ED with complaint of continued left ankle pain after an injury that occurred 1 week ago.  Patient states that he stepped off the court and twisted his left ankle.  He has been taking ibuprofen and using ice with little relief of his pain.  He states it is possible that he had an injury in the past as he played sports in high school.     Physical Exam   Triage Vital Signs: ED Triage Vitals  Enc Vitals Group     BP 02/26/23 1106 (!) 155/93     Pulse Rate 02/26/23 1106 79     Resp 02/26/23 1106 18     Temp 02/26/23 1106 98 F (36.7 C)     Temp src --      SpO2 02/26/23 1106 100 %     Weight --      Height --      Head Circumference --      Peak Flow --      Pain Score 02/26/23 1105 6     Pain Loc --      Pain Edu? --      Excl. in [place]? --     Most recent vital signs: Vitals:   02/26/23 1106  BP: (!) 155/93  Pulse: 79  Resp: 18  Temp: 98 F (36.7 C)  SpO2: 100%     General: Awake, no distress.  CV:  Good peripheral perfusion.  Resp:  Normal effort.  Abd:  No distention.  Other:  On examination of the left ankle there is moderate soft tissue edema and tenderness bimalleolar areas.  No skin discoloration or abrasions are noted.  Pulses present both PT and DP.  Motor sensory function intact.   ED Results / Procedures / Treatments   Labs (all labs ordered are listed, but only abnormal results are displayed) Labs Reviewed - No data to display    RADIOLOGY Left ankle x-ray images were reviewed and interpreted by myself independent of the radiologist and noted a widening of the tibiotalar area.  Radiology also suspects there is a tiny avulsion fracture in this area also.    PROCEDURES:  Critical Care performed:    Procedures   MEDICATIONS ORDERED IN ED: Medications - No data to display   IMPRESSION / MDM / ASSESSMENT AND PLAN / ED COURSE  I reviewed the triage vital signs and the nursing notes.   Differential diagnosis includes, but is not limited to, left ankle sprain, fracture, dislocation, ligamentous injury.  37 year old male presents to the ED with complaint of left ankle pain for 1 week after he stepped off the ball court twisting his left ankle.  He has tried over-the-counter medication without complete relief.  X-ray shows possible ligamentous injury and patient was made aware.  He was placed in a cam walker and a prescription for etodolac 400 mg twice daily was sent to the pharmacy.  He is encouraged to call make an appointment with Dr. Logan Bores who is on-call for podiatry for further evaluation.  He was given a note for work to see if they are willing to let him work with this stone otherwise he is out of work until seen by Wellsite geologist.  Patient's presentation is most consistent with acute complicated illness / injury requiring diagnostic workup.  FINAL CLINICAL IMPRESSION(S) / ED DIAGNOSES   Final diagnoses:  Injury of distal tibiofibular ligament, left, initial encounter     Rx / DC Orders   ED Discharge Orders          Ordered    etodolac (LODINE) 400 MG tablet  2 times daily,   Status:  Discontinued        02/26/23 1216    etodolac (LODINE) 400 MG tablet  2 times daily        02/26/23 1223             Note:  This document was prepared using Dragon voice recognition software and may include unintentional dictation errors.   Tommi Rumps, PA-C 02/26/23 1243    Concha Se, MD 02/27/23 [phone]

## 2023-03-11 ENCOUNTER — Ambulatory Visit (INDEPENDENT_AMBULATORY_CARE_PROVIDER_SITE_OTHER): Payer: Self-pay | Admitting: Podiatry

## 2023-03-11 ENCOUNTER — Encounter: Payer: Self-pay | Admitting: Podiatry

## 2023-03-11 ENCOUNTER — Ambulatory Visit: Payer: Self-pay

## 2023-03-11 DIAGNOSIS — M79672 Pain in left foot: Secondary | ICD-10-CM

## 2023-03-11 MED ORDER — ETODOLAC 400 MG PO TABS: 400.0000 mg | ORAL_TABLET | Freq: Two times a day (BID) | ORAL | 1 refills | Status: DC

## 2023-03-11 NOTE — Progress Notes (Signed)
   Chief Complaint  Patient presents with   Foot Injury    Patienat came in today for left foot and ankle injury, started a month ago when playing basketball, patient has pain on the sides of the ankle, rate of pain 4 out of 10, seen at ER 02/26/2023, X-Rays done and Cam Boot given, TX: Etodolac     HPI: 37 y.o. male presenting today as a new patient referral from the emergency department for an ankle sprain to the left lower extremity.  Patient states that he was playing basketball on 02/19/2023 when he rolled his ankle.  He went to the emergency department at [facility] the following week on 02/26/2023.  Cam boot was dispensed.  X-rays taken at that time.  He was administered etodolac 400 mg tablet and he says there is significant improvement since taking the medicine.  Past Medical History:  Diagnosis Date   Allergy    SEASONAL   ARF (acute renal failure) (HCC) 05/18/2016   Environmental allergies    GERD (gastroesophageal reflux disease)     Past Surgical History:  Procedure Laterality Date   NO PAST SURGERIES      No Known Allergies   Physical Exam: General: The patient is alert and oriented x3 in no acute distress.  Dermatology: Skin is warm, dry and supple bilateral lower extremities.   Vascular: Palpable pedal pulses bilaterally. Capillary refill within normal limits.  Moderate edema.  No ecchymosis.  Neurological: Grossly intact via light touch  Musculoskeletal Exam: Gross alignment of the foot and ankle noted.  There is tenderness throughout palpation of the lateral aspect of the left ankle  Radiographic Exam LT ankle 02/26/2023:  IMPRESSION: 1. New lucency within the lateral aspect of the talar dome, possibly representing an osteochondral lesion. 2. Mild widening of the medial aspect of the tibiotalar joint with tiny ossific density, suggestive of possible ligamentous injury with tiny avulsion fracture.  Assessment/Plan of Care: 1.  Ankle sprain left initial encounter.   DOI: 02/19/2023  -Patient evaluated.  X-rays reviewed that were taken in the emergency department -Overall the patient is noticing significant improvement with Ace wrap and a cam boot. -Patient drives trucks for work.  He may return to work full activity no restrictions with an ankle brace since he is not ambulating much on his feet throughout work. -Ankle brace dispensed today -Recommend cam boot when he is actively walking over the following month -Patient currently does not have any insurance.  If he continues to have pain and tenderness over the following 6 weeks without improvement MRI will be warranted.  He says that he is in the process of getting insurance through his work. -Continue etodolac 400 mg twice daily as prescribed from the ED  -Return to clinic as needed       Felecia Shelling, DPM [facility]  Dr. Felecia Shelling, DPM    [address] [place], [place] [address]                Office [phone]  Fax [phone]

## 2023-03-11 NOTE — Addendum Note (Signed)
Addended by: Felecia Shelling on: 03/11/2023 11:35 AM   Modules accepted: Orders

## 2023-04-15 ENCOUNTER — Other Ambulatory Visit: Payer: Self-pay

## 2023-04-15 ENCOUNTER — Emergency Department
Admission: EM | Admit: 2023-04-15 | Discharge: 2023-04-15 | Disposition: A | Discharge status: Home / Self Care | Payer: Self-pay | Attending: Emergency Medicine | Admitting: Emergency Medicine

## 2023-04-15 DIAGNOSIS — R61 Generalized hyperhidrosis: Secondary | ICD-10-CM | POA: Insufficient documentation

## 2023-04-15 DIAGNOSIS — B349 Viral infection, unspecified: Secondary | ICD-10-CM

## 2023-04-15 DIAGNOSIS — Z20822 Contact with and (suspected) exposure to covid-19: Secondary | ICD-10-CM | POA: Insufficient documentation

## 2023-04-15 LAB — RESP PANEL BY RT-PCR (RSV, FLU A&B, COVID)  RVPGX2
Influenza A by PCR: NEGATIVE
Influenza B by PCR: NEGATIVE
Resp Syncytial Virus by PCR: NEGATIVE
SARS Coronavirus 2 by RT PCR: NEGATIVE

## 2023-04-15 NOTE — ED Notes (Signed)
See triage note  Presents with congestion for couple of days  Then woke up with body aches  Afebrile on arrival

## 2023-04-15 NOTE — ED Triage Notes (Signed)
Pt presents to ER with c/o generalized body aches, and congestion that started this am. Pt states he woke up today with cold sweats.  Denies any cough, or fever.  Pt denies any known sick contacts, but states he was at a large family gathering recently.  Pt is otherwise A&O x4 and in NAD in triage.

## 2023-04-15 NOTE — Discharge Instructions (Signed)
Follow-up with your primary care provider or urgent care if any continued problems or concerns.  Tylenol or ibuprofen if needed for throat pain, body aches, fever, headache.  Increase fluids to stay hydrated.  At this time your COVID test is negative.  You can look on MyChart and see the results of your flu swab.

## 2023-04-15 NOTE — ED Provider Notes (Signed)
   [facility] Provider Note    Event Date/Time   First MD Initiated Contact with Patient 04/15/23 [phone]     (approximate)   History   Generalized Body Aches   HPI  Dennis Harvey is a 37 y.o. male   presents to the ED with complaint of not feeling well this morning.  Patient states that he is aching all over, congestion after attending a funeral over the weekend.  Patient denies any nausea, vomiting or diarrhea.  He reports sweating but is unaware of any fever.  No known sick contacts.  Patient has a history of hyperglycemia, ARF and GERD.      Physical Exam   Triage Vital Signs: ED Triage Vitals [04/15/23 0643]  Enc Vitals Group     BP (!) 147/91     Pulse Rate 92     Resp 17     Temp 97.9 F (36.6 C)     Temp Source Oral     SpO2 98 %     Weight 270 lb (122.5 kg)     Height 5\' 8"  (1.727 m)     Head Circumference      Peak Flow      Pain Score 5     Pain Loc      Pain Edu?      Excl. in [place]?     Most recent vital signs: Vitals:   04/15/23 0643  BP: (!) 147/91  Pulse: 92  Resp: 17  Temp: 97.9 F (36.6 C)  SpO2: 98%     General: Awake, no distress.  Alert, talkative. CV:  Good peripheral perfusion.  Heart regular rate and rhythm. Resp:  Normal effort.  Lungs clear bilaterally. Abd:  No distention.  Soft, nontender. Other:  Normal nasal congestion.   ED Results / Procedures / Treatments   Labs (all labs ordered are listed, but only abnormal results are displayed) Labs Reviewed  RESP PANEL BY RT-PCR (RSV, FLU A&B, COVID)  [id number]       PROCEDURES:  Critical Care performed:   Procedures   MEDICATIONS ORDERED IN ED: Medications - No data to display   IMPRESSION / MDM / ASSESSMENT AND PLAN / ED COURSE  I reviewed the triage vital signs and the nursing notes.   Differential diagnosis includes, but is not limited to, COVID, influenza, upper respiratory infection, viral syndrome, seasonal allergies.  37 year old  male presents to the ED with complaint of not feeling well for 1 day with bodyaches and congestion.  Patient was reassured with COVID and influenza test coming back negative.  Patient is encouraged to use Tylenol/ibuprofen and drink lots of fluids.  A note was given for him to remain out of work today.      Patient's presentation is most consistent with acute complicated illness / injury requiring diagnostic workup.  FINAL CLINICAL IMPRESSION(S) / ED DIAGNOSES   Final diagnoses:  Viral syndrome     Rx / DC Orders   ED Discharge Orders     None        Note:  This document was prepared using Dragon voice recognition software and may include unintentional dictation errors.   Tommi Rumps, PA-C 04/15/23 9604    Minna Antis, MD 04/15/23 [phone]

## 2023-12-16 ENCOUNTER — Ambulatory Visit
Admission: EM | Admit: 2023-12-16 | Discharge: 2023-12-16 | Disposition: A | Discharge status: Home / Self Care | Payer: Managed Care, Other (non HMO) | Attending: Emergency Medicine | Admitting: Emergency Medicine

## 2023-12-16 DIAGNOSIS — Z113 Encounter for screening for infections with a predominantly sexual mode of transmission: Secondary | ICD-10-CM | POA: Insufficient documentation

## 2023-12-16 LAB — POCT URINALYSIS DIP (MANUAL ENTRY)
Bilirubin, UA: NEGATIVE
Blood, UA: NEGATIVE
Glucose, UA: NEGATIVE mg/dL
Ketones, POC UA: NEGATIVE mg/dL
Nitrite, UA: NEGATIVE
Protein Ur, POC: NEGATIVE mg/dL
Spec Grav, UA: >=1.03 — AB
Urobilinogen, UA: 1 U/dL
pH, UA: 6

## 2023-12-16 NOTE — Discharge Instructions (Addendum)
Labs pending 2-3 days, you will be contacted if positive for any sti and treatment will be sent to the pharmacy, you will have to return to the clinic if positive for gonorrhea to receive treatment   Please refrain from having sex until labs results, if positive please refrain from having sex until treatment complete and symptoms resolve   If positive for HIV, Syphilis, Chlamydia  gonorrhea or trichomoniasis please notify partner or partners so they may tested as well  Moving forward, it is recommended you use some form of protection against the transmission of sti infections  such as condoms or dental dams with each sexual encounter   

## 2023-12-16 NOTE — ED Triage Notes (Signed)
Provider triaged.  

## 2023-12-16 NOTE — ED Provider Notes (Addendum)
 Dennis Harvey    CSN: [id number] Arrival date & time: 12/16/23  1831      History   Chief Complaint Chief Complaint  Patient presents with   SEXUALLY TRANSMITTED DISEASE    HPI Dennis Harvey is a 38 y.o. male.   Patient presents for routine STI testing.  Denying all symptoms.  Requesting urinalysis.  Denies all urinary symptoms.  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 Authorizing Provider  aspirin  81 MG chewable tablet Chew by mouth. 03/04/17   [provider]  calcium carbonate (OS-CAL) 1250 (500 Ca) MG chewable tablet Chew by mouth.    [provider]  esomeprazole (NEXIUM) 20 MG capsule Take by mouth.    [provider]  loratadine (CLARITIN) 10 MG tablet Take 1 tablet by mouth daily.    [provider]    Family History Family History  Problem Relation Age of Onset   Diabetes Maternal Grandmother    Heart failure Maternal Grandmother    Hyperlipidemia Maternal Grandmother    Hypertension Maternal Grandmother    Diabetes Father    Hypertension Father    Deep vein thrombosis Father    Heart block Father        has stents, non-smoker   Heart disease Father    Prostate cancer Father    Colon cancer Neg Hx    Throat cancer Neg Hx    Esophageal cancer Neg Hx     Social History Social  History   Tobacco Use   Smoking status: Light Smoker    Types: Cigars   Smokeless tobacco: Never   Tobacco comments:    05/21/2016 "stopped smoking cigarettes in ~ 2007"  Vaping Use   Vaping status: Never Used  Substance Use Topics   Alcohol use: Yes    Alcohol/week: 1.0 standard drink of alcohol    Types: 1 Shots of liquor per week    Comment: SOCIALLY   Drug use: No     Allergies   Patient has no known allergies.   Review of Systems Review of Systems   Physical Exam Triage Vital Signs ED Triage Vitals  Encounter Vitals Group     BP      Systolic BP Percentile      Diastolic BP Percentile      Pulse      Resp      Temp      Temp src      SpO2      Weight      Height      Head Circumference      Peak Flow      Pain Score      Pain Loc      Pain Education      Exclude from Growth Chart  No data found.  Updated Vital Signs There were no vitals taken for this visit.  Visual Acuity Right Eye Distance:   Left Eye Distance:   Bilateral Distance:    Right Eye Near:   Left Eye Near:    Bilateral Near:     Physical Exam Constitutional:      Appearance: Normal appearance.  Eyes:     Extraocular Movements: Extraocular movements intact.  Pulmonary:     Effort: Pulmonary effort is normal.  Genitourinary:    Comments: deferred Neurological:     Mental Status: He is alert and oriented to person, place, and time. Mental status is at baseline.      UC Treatments / Results  Labs (all labs ordered are listed, but only abnormal results are displayed) Labs Reviewed  RPR  HIV ANTIBODY (ROUTINE TESTING W REFLEX)  CYTOLOGY, (ORAL, ANAL, URETHRAL) ANCILLARY ONLY    EKG   Radiology No results found.  Procedures Procedures (including critical care time)  Medications Ordered in UC Medications - No data to display  Initial Impression / Assessment and Plan / UC Course  I have reviewed the triage vital signs and the nursing notes.  Pertinent labs  & imaging results that were available during my care of the patient were reviewed by me and considered in my medical decision making (see chart for details).  Routine screening for STI   urinalysis negative.  STI labs pending will treat per protocol, advised abstinence until lab results, and/or treatment is complete, advised condom use during all sexual encounters moving, may follow-up with urgent care as needed  Final Clinical Impressions(s) / UC Diagnoses   Final diagnoses:  Routine screening for STI (sexually transmitted infection)     Discharge Instructions      Labs pending 2-3 days, you will be contacted if positive for any sti and treatment will be sent to the pharmacy, you will have to return to the clinic if positive for gonorrhea to receive treatment   Please refrain from having sex until labs results, if positive please refrain from having sex until treatment complete and symptoms resolve   If positive for HIV, Syphilis, Chlamydia  gonorrhea or trichomoniasis please notify partner or partners so they may tested as well  Moving forward, it is recommended you use some form of protection against the transmission of sti infections  such as condoms or dental dams with each sexual encounter     ED Prescriptions   None    PDMP not reviewed this encounter.   Reena Canning, NP 12/16/23 1930    Reena Canning, NP 12/16/23 [phone]

## 2023-12-17 LAB — CYTOLOGY, (ORAL, ANAL, URETHRAL) ANCILLARY ONLY
Chlamydia: POSITIVE — AB
Comment: NEGATIVE
Comment: NEGATIVE
Comment: NORMAL
Neisseria Gonorrhea: NEGATIVE
Trichomonas: NEGATIVE

## 2023-12-18 ENCOUNTER — Telehealth (HOSPITAL_COMMUNITY): Payer: Self-pay

## 2023-12-18 LAB — HIV ANTIBODY (ROUTINE TESTING W REFLEX): HIV Screen 4th Generation wRfx: NONREACTIVE

## 2023-12-18 LAB — RPR: RPR Ser Ql: NONREACTIVE

## 2023-12-18 MED ORDER — DOXYCYCLINE HYCLATE 100 MG PO TABS: 100.0000 mg | ORAL_TABLET | Freq: Two times a day (BID) | ORAL | 0 refills | Status: AC

## 2023-12-18 NOTE — Telephone Encounter (Signed)
Per protocol, pt requires tx with Doxycycline.  Reviewed with patient, verified pharmacy, prescription sent.
